# Patient Record
Sex: Female | Born: 1965
Health system: Southern US, Community
[De-identification: ages and names within clinical notes are randomized; demographics above are authoritative.]

## PROBLEM LIST (undated history)

## (undated) DIAGNOSIS — K589 Irritable bowel syndrome without diarrhea: Secondary | ICD-10-CM

## (undated) DIAGNOSIS — M069 Rheumatoid arthritis, unspecified: Secondary | ICD-10-CM

## (undated) DIAGNOSIS — M1990 Unspecified osteoarthritis, unspecified site: Secondary | ICD-10-CM

## (undated) DIAGNOSIS — T8859XA Other complications of anesthesia, initial encounter: Secondary | ICD-10-CM

## (undated) DIAGNOSIS — R112 Nausea with vomiting, unspecified: Secondary | ICD-10-CM

## (undated) DIAGNOSIS — S83209A Unspecified tear of unspecified meniscus, current injury, unspecified knee, initial encounter: Secondary | ICD-10-CM

## (undated) DIAGNOSIS — K219 Gastro-esophageal reflux disease without esophagitis: Secondary | ICD-10-CM

## (undated) DIAGNOSIS — Z9889 Other specified postprocedural states: Secondary | ICD-10-CM

## (undated) HISTORY — DX: Unspecified osteoarthritis, unspecified site: M19.90

## (undated) HISTORY — DX: Gastro-esophageal reflux disease without esophagitis: K21.9

## (undated) HISTORY — PX: JOINT REPLACEMENT: SHX530

## (undated) HISTORY — DX: Irritable bowel syndrome, unspecified: K58.9

## (undated) HISTORY — PX: OVARY SURGERY: SHX727

## (undated) HISTORY — DX: Rheumatoid arthritis, unspecified: M06.9

## (undated) HISTORY — PX: FOOT SURGERY: SHX648

## (undated) HISTORY — DX: Unspecified tear of unspecified meniscus, current injury, unspecified knee, initial encounter: S83.209A

---

## 1998-06-27 ENCOUNTER — Encounter: Admission: RE | Admit: 1998-06-27 | Discharge: 1998-06-27 | Payer: Self-pay | Admitting: Family Medicine

## 1998-07-04 ENCOUNTER — Encounter: Admission: RE | Admit: 1998-07-04 | Discharge: 1998-07-04 | Payer: Self-pay | Admitting: Family Medicine

## 1998-11-25 ENCOUNTER — Other Ambulatory Visit: Admission: RE | Admit: 1998-11-25 | Discharge: 1998-11-25 | Payer: Self-pay | Admitting: Gynecology

## 1999-12-21 ENCOUNTER — Other Ambulatory Visit: Admission: RE | Admit: 1999-12-21 | Discharge: 1999-12-21 | Payer: Self-pay | Admitting: Gynecology

## 2001-01-02 ENCOUNTER — Other Ambulatory Visit: Admission: RE | Admit: 2001-01-02 | Discharge: 2001-01-02 | Payer: Self-pay | Admitting: Gynecology

## 2002-01-11 ENCOUNTER — Other Ambulatory Visit: Admission: RE | Admit: 2002-01-11 | Discharge: 2002-01-11 | Payer: Self-pay | Admitting: Gynecology

## 2003-05-06 ENCOUNTER — Other Ambulatory Visit: Admission: RE | Admit: 2003-05-06 | Discharge: 2003-05-06 | Payer: Self-pay | Admitting: Gynecology

## 2004-05-20 ENCOUNTER — Other Ambulatory Visit: Admission: RE | Admit: 2004-05-20 | Discharge: 2004-05-20 | Payer: Self-pay | Admitting: Gynecology

## 2004-11-26 ENCOUNTER — Ambulatory Visit: Payer: Self-pay | Admitting: Gastroenterology

## 2005-05-20 ENCOUNTER — Other Ambulatory Visit: Admission: RE | Admit: 2005-05-20 | Discharge: 2005-05-20 | Payer: Self-pay | Admitting: Gynecology

## 2005-12-02 ENCOUNTER — Emergency Department (HOSPITAL_COMMUNITY): Admission: EM | Admit: 2005-12-02 | Discharge: 2005-12-02 | Payer: Self-pay | Admitting: Emergency Medicine

## 2005-12-09 ENCOUNTER — Emergency Department (HOSPITAL_COMMUNITY): Admission: EM | Admit: 2005-12-09 | Discharge: 2005-12-09 | Payer: Self-pay | Admitting: Emergency Medicine

## 2006-09-26 ENCOUNTER — Emergency Department (HOSPITAL_COMMUNITY): Admission: EM | Admit: 2006-09-26 | Discharge: 2006-09-27 | Payer: Self-pay | Admitting: Emergency Medicine

## 2008-05-09 ENCOUNTER — Other Ambulatory Visit: Admission: RE | Admit: 2008-05-09 | Discharge: 2008-05-09 | Payer: Self-pay | Admitting: Gynecology

## 2010-05-20 ENCOUNTER — Emergency Department (HOSPITAL_COMMUNITY): Admission: EM | Admit: 2010-05-20 | Discharge: 2010-05-20 | Payer: Self-pay | Admitting: Family Medicine

## 2011-10-17 ENCOUNTER — Emergency Department (HOSPITAL_COMMUNITY)
Admission: EM | Admit: 2011-10-17 | Discharge: 2011-10-17 | Disposition: A | Discharge status: Home / Self Care | Payer: BC Managed Care – PPO | Attending: Emergency Medicine | Admitting: Emergency Medicine

## 2011-10-17 ENCOUNTER — Emergency Department (HOSPITAL_COMMUNITY)
Admission: EM | Admit: 2011-10-17 | Discharge: 2011-10-17 | Disposition: A | Discharge status: Home / Self Care | Payer: BC Managed Care – PPO | Source: Home / Self Care | Attending: Family Medicine | Admitting: Family Medicine

## 2011-10-17 ENCOUNTER — Encounter (HOSPITAL_COMMUNITY): Payer: Self-pay | Admitting: Emergency Medicine

## 2011-10-17 DIAGNOSIS — R51 Headache: Secondary | ICD-10-CM | POA: Insufficient documentation

## 2011-10-17 DIAGNOSIS — N898 Other specified noninflammatory disorders of vagina: Secondary | ICD-10-CM | POA: Insufficient documentation

## 2011-10-17 DIAGNOSIS — R519 Headache, unspecified: Secondary | ICD-10-CM

## 2011-10-17 DIAGNOSIS — R350 Frequency of micturition: Secondary | ICD-10-CM | POA: Insufficient documentation

## 2011-10-17 LAB — URINE MICROSCOPIC-ADD ON

## 2011-10-17 LAB — POCT I-STAT, CHEM 8
Calcium, Ion: 1.27 mmol/L (ref 1.12–1.32)
Creatinine, Ser: 0.8 mg/dL (ref 0.50–1.10)
Glucose, Bld: 89 mg/dL (ref 70–99)
HCT: 43 % (ref 36.0–46.0)
Hemoglobin: 14.6 g/dL (ref 12.0–15.0)
TCO2: 30 mmol/L (ref 0–100)

## 2011-10-17 LAB — URINALYSIS, ROUTINE W REFLEX MICROSCOPIC
Glucose, UA: NEGATIVE mg/dL
Ketones, ur: NEGATIVE mg/dL
Protein, ur: NEGATIVE mg/dL
pH: 6 (ref 5.0–8.0)

## 2011-10-17 LAB — SEDIMENTATION RATE: Sed Rate: 40 mm/hr — ABNORMAL HIGH (ref 0–22)

## 2011-10-17 LAB — POCT PREGNANCY, URINE: Preg Test, Ur: NEGATIVE

## 2011-10-17 MED ORDER — KETOROLAC TROMETHAMINE 30 MG/ML IJ SOLN
30.0000 mg | Freq: Once | INTRAMUSCULAR | Status: AC
  Administered 2011-10-17: 30 mg via INTRAMUSCULAR
  Filled 2011-10-17: qty 1

## 2011-10-17 NOTE — ED Provider Notes (Signed)
History     CSN: [id number]  Arrival date & time 10/17/11  1818   First MD Initiated Contact with Patient 10/17/11 2001      Chief Complaint  Patient presents with  . Headache     HPI  History provided by the patient. Patient is a 46 year old African American female with no significant past medical history who presents urgent care Center for further evaluation of episodic right-sided headaches for the past 4 days. Patient reports having sharp episodic pains to the right temporal and posterior area of the head lasting about 30 seconds and occurring 3-4 times an hour. Symptoms began acutely. They're episodic and described as moderate. She has taken one dose of Aleve earlier in the day they gave her several hours of relief of symptoms. Patient also reports having small amounts of vaginal spotting this morning. Her last menstrual period was in November. Patient states that she believes she may be going through menopause. She denies having any hot flashes or night sweats. She denies any focal neurological deficits. Patient denies having similar symptoms previously. She denies any other aggravating or alleviating factors of headache. Patient does not have history of hypertension, hyperlipidemia, diabetes.    History reviewed. No pertinent past medical history.  History reviewed. No pertinent past surgical history.  No family history on file.  History  Substance Use Topics  . Smoking status: Not on file  . Smokeless tobacco: Not on file  . Alcohol Use: No    OB History    Grav Para Term Preterm Abortions TAB SAB Ect Mult Living                  Review of Systems  Constitutional: Negative for fever, chills, appetite change, fatigue and unexpected weight change.  HENT: Negative for ear pain, congestion, sore throat and rhinorrhea.   Respiratory: Negative for cough.   Genitourinary: Positive for frequency and vaginal bleeding. Negative for dysuria, hematuria, flank pain and vaginal  discharge.  Neurological: Positive for headaches. Negative for dizziness, speech difficulty, weakness, light-headedness and numbness.  Psychiatric/Behavioral: Negative for confusion.  All other systems reviewed and are negative.    Allergies  Review of patient's allergies indicates no known allergies.  Home Medications   Current Outpatient Rx  Name Route Sig Dispense Refill  . IBUPROFEN 200 MG PO TABS Oral Take 400 mg by mouth every 6 (six) hours as needed. For pain    . ADULT MULTIVITAMIN W/MINERALS CH Oral Take 1 tablet by mouth daily.      BP 130/75  Pulse 60  Temp(Src) 98 F (36.7 C) (Oral)  Resp 16  SpO2 100%  Physical Exam  Nursing note and vitals reviewed. Constitutional: She is oriented to person, place, and time. She appears well-developed and well-nourished. No distress.  HENT:  Head: Normocephalic and atraumatic.  Mouth/Throat: Oropharynx is clear and moist.  Eyes: Conjunctivae and EOM are normal. Pupils are equal, round, and reactive to light.  Neck: Normal range of motion. Neck supple. No tracheal deviation present.       No cervical midline tenderness  Cardiovascular: Normal rate and regular rhythm.   Pulmonary/Chest: Effort normal and breath sounds normal. No respiratory distress. She has no wheezes. She has no rales.  Musculoskeletal: She exhibits no edema and no tenderness.  Neurological: She is alert and oriented to person, place, and time. She has normal strength. No cranial nerve deficit or sensory deficit. Coordination and gait normal.  Skin: Skin is warm and dry. No  rash noted.  Psychiatric: She has a normal mood and affect. Her behavior is normal.    ED Course  Procedures  Results for orders placed during the hospital encounter of 10/17/11  SEDIMENTATION RATE      Component Value Range   Sed Rate 40 (*) 0 - 22 (mm/hr)  URINALYSIS, ROUTINE W REFLEX MICROSCOPIC      Component Value Range   Color, Urine YELLOW  YELLOW    APPearance CLEAR  CLEAR     Specific Gravity, Urine 1.011  1.005 - 1.030    pH 6.0  5.0 - 8.0    Glucose, UA NEGATIVE  NEGATIVE (mg/dL)   Hgb urine dipstick SMALL (*) NEGATIVE    Bilirubin Urine NEGATIVE  NEGATIVE    Ketones, ur NEGATIVE  NEGATIVE (mg/dL)   Protein, ur NEGATIVE  NEGATIVE (mg/dL)   Urobilinogen, UA 0.2  0.0 - 1.0 (mg/dL)   Nitrite NEGATIVE  NEGATIVE    Leukocytes, UA NEGATIVE  NEGATIVE   POCT PREGNANCY, URINE      Component Value Range   Preg Test, Ur NEGATIVE  NEGATIVE   POCT I-STAT, CHEM 8      Component Value Range   Sodium 142  135 - 145 (mEq/L)   Potassium 3.9  3.5 - 5.1 (mEq/L)   Chloride 104  96 - 112 (mEq/L)   BUN 15  6 - 23 (mg/dL)   Creatinine, Ser 4.09  0.50 - 1.10 (mg/dL)   Glucose, Bld 89  70 - 99 (mg/dL)   Calcium, Ion 8.11  9.14 - 1.32 (mmol/L)   TCO2 30  0 - 100 (mmol/L)   Hemoglobin 14.6  12.0 - 15.0 (g/dL)   HCT 78.2  95.6 - 21.3 (%)  URINE MICROSCOPIC-ADD ON      Component Value Range   Squamous Epithelial / LPF FEW (*) RARE    RBC / HPF 0-2  <3 (RBC/hpf)      1. Headache       MDM  8:05 PM patient seen and evaluated. Patient in no acute distress   9:15 PM patient reports feeling much better after medicine. She denies headache at this time.     Angus Seller, [place] 10/18/11 [phone]

## 2011-10-17 NOTE — ED Notes (Signed)
Pt sent from [facility] for further evaluation of headache.  C/o headache since Thursday.

## 2011-10-17 NOTE — Discharge Instructions (Signed)
You were seen and evaluated today for your symptoms of right-sided headache. At this time your lab tests and examination did not show any signs for concerning cause of your symptoms. Your providers today feel that you are able to return home and followup with your primary care provider for continued evaluation and treatment. Continue to use Tylenol or ibuprofen or Aleve for your symptoms. If you develop any continuous pain, fever, chills, persistent nausea vomiting, neurologic changes such as numbness, weakness, paralysis, change in speech, confusion please return to the emergency room immediately.  Headache, General, Unknown Cause The specific cause of your headache may not have been found today. There are many causes and types of headache. A few common ones are:  Tension headache.   Migraine.   Infections (examples: dental and sinus infections).   Bone and/or joint problems in the neck or jaw.   Depression.   Eye problems.  These headaches are not life threatening.  Headaches can sometimes be diagnosed by a patient history and a physical exam. Sometimes, lab and imaging studies (such as x-ray and/or CT scan) are used to rule out more serious problems. In some cases, a spinal tap (lumbar puncture) may be requested. There are many times when your exam and tests may be normal on the first visit even when there is a serious problem causing your headaches. Because of that, it is very important to follow up with your doctor or local clinic for further evaluation. FINDING OUT THE RESULTS OF TESTS  If a radiology test was performed, a radiologist will review your results.   You will be contacted by the emergency department or your physician if any test results require a change in your treatment plan.   Not all test results may be available during your visit. If your test results are not back during the visit, make an appointment with your caregiver to find out the results. Do not assume everything  is normal if you have not heard from your caregiver or the medical facility. It is important for you to follow up on all of your test results.  HOME CARE INSTRUCTIONS   Keep follow-up appointments with your caregiver, or any specialist referral.   Only take over-the-counter or prescription medicines for pain, discomfort, or fever as directed by your caregiver.   Biofeedback, massage, or other relaxation techniques may be helpful.   Ice packs or heat applied to the head and neck can be used. Do this three to four times per day, or as needed.   Call your doctor if you have any questions or concerns.   If you smoke, you should quit.  SEEK MEDICAL CARE IF:   You develop problems with medications prescribed.   You do not respond to or obtain relief from medications.   You have a change from the usual headache.   You develop nausea or vomiting.  SEEK IMMEDIATE MEDICAL CARE IF:   If your headache becomes severe.   You have an unexplained oral temperature above 102 F (38.9 C), or as your caregiver suggests.   You have a stiff neck.   You have loss of vision.   You have muscular weakness.   You have loss of muscular control.   You develop severe symptoms different from your first symptoms.   You start losing your balance or have trouble walking.   You feel faint or pass out.  MAKE SURE YOU:   Understand these instructions.   Will watch your condition.  Will get help right away if you are not doing well or get worse.  Document Released: 08/09/2005 Document Revised: 04/21/2011 Document Reviewed: 03/28/2008 [facility] Patient Information 2012 [place], [place].

## 2011-10-17 NOTE — ED Provider Notes (Signed)
History     CSN: [id number]  Arrival date & time 10/17/11  1515   First MD Initiated Contact with Patient 10/17/11 1518      Chief Complaint  Patient presents with  . Headache    (Consider location/radiation/quality/duration/timing/severity/associated sxs/prior treatment) Patient is a 46 y.o. female presenting with headaches. The history is provided by the patient.  Headache The primary symptoms include headaches. Primary symptoms do not include syncope, seizures, dizziness, visual change, focal weakness, loss of sensation, speech change, fever, nausea or vomiting. Primary symptoms comment: Started Thursday different than her other headaches thjat will respond to antiinflamatories and go away The symptoms began 3 to 5 days ago. The symptoms are waxing and waning. The neurological symptoms are focal.  The headache is not associated with aura, photophobia, visual change, neck stiffness or weakness.  Additional symptoms do not include neck stiffness, weakness, lower back pain, photophobia, aura, taste disturbance, hearing loss, tinnitus or irritability. Medical issues do not include seizures, cerebral vascular accident, drug use or diabetes.    History reviewed. No pertinent past medical history.  History reviewed. No pertinent past surgical history.  History reviewed. No pertinent family history.  History  Substance Use Topics  . Smoking status: Not on file  . Smokeless tobacco: Not on file  . Alcohol Use: No    OB History    Grav Para Term Preterm Abortions TAB SAB Ect Mult Living                  Review of Systems  Constitutional: Negative for fever, activity change and irritability.  HENT: Negative for hearing loss, neck stiffness and tinnitus.   Eyes: Negative for photophobia.  Cardiovascular: Negative for syncope.  Gastrointestinal: Negative for nausea and vomiting.  Genitourinary: Positive for vaginal bleeding and menstrual problem.       Patient has not had a  menstrual cycle since November and she started spoting this AM . She denies any recent sexual activity.  Neurological: Positive for headaches. Negative for dizziness, speech change, focal weakness, seizures and weakness.    Allergies  Review of patient's allergies indicates no known allergies.  Home Medications  No current outpatient prescriptions on file.  BP 128/76  Pulse 77  Temp(Src) 98.9 F (37.2 C) (Oral)  Resp 14  SpO2 98%  Physical Exam  Constitutional: She is oriented to person, place, and time. She appears well-developed and well-nourished.  HENT:  Head: Normocephalic and atraumatic.  Right Ear: Hearing and external ear normal.  Left Ear: Hearing and external ear normal.       Negative for TMJ tenderness Excessive cerumen in both ear canals  Eyes: Conjunctivae and EOM are normal. Pupils are equal, round, and reactive to light.  Fundoscopic exam:      The right eye shows no arteriolar narrowing and no AV nicking.       The left eye shows no arteriolar narrowing and no AV nicking.  Neck: Normal range of motion. Neck supple.  Musculoskeletal: Normal range of motion. She exhibits no edema.  Neurological: She is alert and oriented to person, place, and time. She displays normal reflexes. No cranial nerve deficit.  Skin: Skin is warm and dry. No erythema.  Psychiatric: She has a normal mood and affect. Her behavior is normal.    ED Course  Procedures (including critical care time)  Labs Reviewed - No data to display New headache    MDM          Hassan Rowan,  MD 10/17/11 1812

## 2011-10-17 NOTE — ED Notes (Signed)
Pt having "trigger" headache in one spot in her head since Thursday. Pain comes and goes up to a level of 7. She usually only has headaches when she is menstruating. She started spotting today after not having her period since November, she believes she is beginning menopause.

## 2011-10-18 NOTE — ED Provider Notes (Signed)
Medical screening examination/treatment/procedure(s) were performed by non-physician practitioner and as supervising physician I was immediately available for consultation/collaboration.  Kyarah Enamorado T Crissa Sowder, MD 10/18/11 2235 

## 2011-10-21 ENCOUNTER — Telehealth (HOSPITAL_COMMUNITY): Payer: Self-pay | Admitting: *Deleted

## 2011-10-21 NOTE — ED Notes (Signed)
2/25 Pt. called on VM with a question about her tx. 2/27 1452 Left message to call. 2/28 1450 Left message to call. Pt. called back and said she was sent to ED but CT scan was cancelled due to lack of neurological symptoms. Pt. told to continue Advil.  She said her bill was $200.00 and Dr. Thurmond Butts told her if she waited and was discharged by the ED she would only get 1 bill. I told her any billing questions she needs to call the billing office. She said she called them but they did not call her back.  I told her to call again. Pt. also said Dr. Thurmond Butts talked about something for ear wax that begins with D. I told her she can get Debrox ear wax softener OTC at the pharmacy. She said he was going to prescribe her something to help relax. I explained that we do not do Rx.'s for patients going to the ED. They discharge you and give you Rx.'s they feel like you need.  Danielle Pace 10/21/2011

## 2013-07-06 ENCOUNTER — Encounter: Payer: Self-pay | Admitting: Gastroenterology

## 2013-08-08 ENCOUNTER — Encounter: Payer: Self-pay | Admitting: Gastroenterology

## 2013-08-08 ENCOUNTER — Ambulatory Visit (INDEPENDENT_AMBULATORY_CARE_PROVIDER_SITE_OTHER): Payer: BC Managed Care – PPO | Admitting: Gastroenterology

## 2013-08-08 VITALS — BP 100/60 | HR 80 | Ht 59.75 in | Wt 145.2 lb

## 2013-08-08 DIAGNOSIS — K3189 Other diseases of stomach and duodenum: Secondary | ICD-10-CM | POA: Insufficient documentation

## 2013-08-08 DIAGNOSIS — Z1211 Encounter for screening for malignant neoplasm of colon: Secondary | ICD-10-CM

## 2013-08-08 MED ORDER — ESOMEPRAZOLE MAGNESIUM 40 MG PO CPDR: 40.0000 mg | DELAYED_RELEASE_CAPSULE | Freq: Every day | ORAL | Status: DC

## 2013-08-08 MED ORDER — NA SULFATE-K SULFATE-MG SULF 17.5-3.13-1.6 GM/177ML PO SOLN: 1.0000 | Freq: Once | ORAL | Status: DC

## 2013-08-08 NOTE — Progress Notes (Signed)
    _                                                                                                                History of Present Illness: 47 year old Afro-American female referred for evaluation of abdominal discomfort. She's complaining of frequent pyrosis, postprandial bloating and excess belching.  She has taken various over-the-counter medications with some relief.  She rarely takes ibuprofen.  There has been no change in bowel habits.  Bowels tend to be irregular.  She denies melena or hematochezia.  Her twin sister had colon polyps.    Past Medical History  Diagnosis Date  . Arthritis   . Irritable bowel syndrome    Past Surgical History  Procedure Laterality Date  . Foot surgery    . Ovary surgery     family history includes Breast cancer in her mother; Colon polyps in her sister; Diabetes in her mother; Hypertension in her mother; Kidney disease in her sister. Current Outpatient Prescriptions  Medication Sig Dispense Refill  . ibuprofen (ADVIL,MOTRIN) 200 MG tablet Take 400 mg by mouth every 6 (six) hours as needed. For pain      . Multiple Vitamin (MULITIVITAMIN WITH MINERALS) TABS Take 1 tablet by mouth daily.       No current facility-administered medications for this visit.   Allergies as of 08/08/2013  . (No Known Allergies)    reports that she has never smoked. She has never used smokeless tobacco. She reports that she does not drink alcohol or use illicit drugs.     Review of Systems: Pertinent positive and negative review of systems were noted in the above HPI section. All other review of systems were otherwise negative.  Vital signs were reviewed in today's medical record Physical Exam: General: Well developed , well nourished, no acute distress Skin: anicteric Head: Normocephalic and atraumatic Eyes:  sclerae anicteric, EOMI Ears: Normal auditory acuity Mouth: No deformity or lesions Neck: Supple, no masses or thyromegaly Lungs:  Clear throughout to auscultation Heart: Regular rate and rhythm; no murmurs, rubs or bruits Abdomen: Soft, non tender and non distended. No masses, hepatosplenomegaly or hernias noted. Normal Bowel sounds.  There is no succussion splash Rectal:deferred Musculoskeletal: Symmetrical with no gross deformities  Skin: No lesions on visible extremities Pulses:  Normal pulses noted Extremities: No clubbing, cyanosis, edema or deformities noted Neurological: Alert oriented x 4, grossly nonfocal Cervical Nodes:  No significant cervical adenopathy Inguinal Nodes: No significant inguinal adenopathy Psychological:  Alert and cooperative. Normal mood and affect  See Assessment and Plan under Problem List

## 2013-08-08 NOTE — Progress Notes (Signed)
.  rk1

## 2013-08-08 NOTE — Patient Instructions (Signed)

## 2013-08-08 NOTE — Assessment & Plan Note (Signed)
Identical twin sister had colon polyps at age 48  Recommendations #1 screening colonoscopy

## 2013-08-08 NOTE — Assessment & Plan Note (Signed)
The patient's symptoms are likely related to acid reflux.  She also has symptoms suggestive of nonspecific dyspepsia.  Recommendations #1 begin Nexium 40 mg daily.  If symptoms do not improve after several weeks of therapy I would consider upper endoscopy

## 2013-08-13 ENCOUNTER — Encounter: Payer: Self-pay | Admitting: Gastroenterology

## 2013-09-13 ENCOUNTER — Ambulatory Visit (AMBULATORY_SURGERY_CENTER): Payer: BC Managed Care – PPO | Admitting: Gastroenterology

## 2013-09-13 ENCOUNTER — Encounter: Payer: Self-pay | Admitting: Gastroenterology

## 2013-09-13 VITALS — BP 114/73 | HR 68 | Temp 98.6°F | Resp 21 | Ht 59.0 in | Wt 145.0 lb

## 2013-09-13 DIAGNOSIS — Z8371 Family history of colonic polyps: Secondary | ICD-10-CM

## 2013-09-13 DIAGNOSIS — Z83719 Family history of colon polyps, unspecified: Secondary | ICD-10-CM

## 2013-09-13 DIAGNOSIS — Z1211 Encounter for screening for malignant neoplasm of colon: Secondary | ICD-10-CM

## 2013-09-13 MED ORDER — SODIUM CHLORIDE 0.9 % IV SOLN: 500.0000 mL | INTRAVENOUS | Status: DC

## 2013-09-13 NOTE — Progress Notes (Signed)
Pt. Monitored throughout recovery period.  Vital signs were stable, although they were not tranferred to computer.  Pt. Doing well at discharge .   Vital signs stable.

## 2013-09-13 NOTE — Op Note (Signed)
[facility] [address] [place], [address]   COLONOSCOPY PROCEDURE REPORT  PATIENT: Danielle Pace, Danielle Pace  MR#: [id number] BIRTHDATE: [birth year] , 47  yrs. old GENDER: Female ENDOSCOPIST: Louis Meckel, MD REFERRED QT:[id number] Kevan Ny, M.D. PROCEDURE DATE:  09/13/2013 PROCEDURE:   Colonoscopy, diagnostic First Screening Colonoscopy - Avg.  risk and is 50 yrs.  old or older Yes.  Prior Negative Screening - Now for repeat screening. N/A  History of Adenoma - Now for follow-up colonoscopy & has been > or = to 3 yrs.  N/A  Polyps Removed Today? No.  Recommend repeat exam, <10 yrs? No. ASA CLASS:   Class II INDICATIONS:Patient's family history of colon polyps identical twin sister MEDICATIONS: propofol (Diprivan) 300mg  IV  DESCRIPTION OF PROCEDURE:   After the risks benefits and alternatives of the procedure were thoroughly explained, informed consent was obtained.  A digital rectal exam revealed no abnormalities of the rectum.   The LB [id number]  endoscope was introduced through the anus and advanced to the cecum, which was identified by both the appendix and ileocecal valve. No adverse events experienced.   The quality of the prep was excellent using Suprep  The instrument was then slowly withdrawn as the colon was fully examined.      COLON FINDINGS: A normal appearing cecum, ileocecal valve, and appendiceal orifice were identified.  The ascending, hepatic flexure, transverse, splenic flexure, descending, sigmoid colon and rectum appeared unremarkable.  No polyps or cancers were seen. Retroflexion was not performed due to a narrow rectal vault. The time to cecum=3 minutes 06 seconds.  Withdrawal time=9 minutes 32 seconds.  The scope was withdrawn and the procedure completed. COMPLICATIONS: There were no complications.  ENDOSCOPIC IMPRESSION: Normal colon  RECOMMENDATIONS: Continue current colorectal screening recommendations for "routine risk"  patients with a repeat colonoscopy in 10 years.   eSigned:  [id number], MD 09/13/2013 3:43 PM   cc:   PATIENT NAME:  Nella, [place] MR#: [name]

## 2013-09-13 NOTE — Progress Notes (Signed)
A/ox3 pleased with MAC, report to Jane RN 

## 2013-09-13 NOTE — Patient Instructions (Signed)
YOU HAD AN ENDOSCOPIC PROCEDURE TODAY AT THE [facility]: Refer to the procedure report that was given to you for any specific questions about what was found during the examination.  If the procedure report does not answer your questions, please call your gastroenterologist to clarify.  If you requested that your care partner not be given the details of your procedure findings, then the procedure report has been included in a sealed envelope for you to review at your convenience later.  YOU SHOULD EXPECT: Some feelings of bloating in the abdomen. Passage of more gas than usual.  Walking can help get rid of the air that was put into your GI tract during the procedure and reduce the bloating. If you had a lower endoscopy (such as a colonoscopy or flexible sigmoidoscopy) you may notice spotting of blood in your stool or on the toilet paper. If you underwent a bowel prep for your procedure, then you may not have a normal bowel movement for a few days.  DIET: Your first meal following the procedure should be a light meal and then it is ok to progress to your normal diet.  A half-sandwich or bowl of soup is an example of a good first meal.  Heavy or fried foods are harder to digest and may make you feel nauseous or bloated.  Likewise meals heavy in dairy and vegetables can cause extra gas to form and this can also increase the bloating.  Drink plenty of fluids but you should avoid alcoholic beverages for 24 hours.  ACTIVITY: Your care partner should take you home directly after the procedure.  You should plan to take it easy, moving slowly for the rest of the day.  You can resume normal activity the day after the procedure however you should NOT DRIVE or use heavy machinery for 24 hours (because of the sedation medicines used during the test).    SYMPTOMS TO REPORT IMMEDIATELY: A gastroenterologist can be reached at any hour.  During normal business hours, 8:30 AM to 5:00 PM Monday through Friday,  call [phone].  After hours and on weekends, please call the GI answering service at [phone] who will take a message and have the physician on call contact you.   Following lower endoscopy (colonoscopy or flexible sigmoidoscopy):  Excessive amounts of blood in the stool  Significant tenderness or worsening of abdominal pains  Swelling of the abdomen that is new, acute  Fever of 100F or higher    FOLLOW UP: If any biopsies were taken you will be contacted by phone or by letter within the next 1-3 weeks.  Call your gastroenterologist if you have not heard about the biopsies in 3 weeks.  Our staff will call the home number listed on your records the next business day following your procedure to check on you and address any questions or concerns that you may have at that time regarding the information given to you following your procedure. This is a courtesy call and so if there is no answer at the home number and we have not heard from you through the emergency physician on call, we will assume that you have returned to your regular daily activities without incident.  SIGNATURES/CONFIDENTIALITY: You and/or your care partner have signed paperwork which will be entered into your electronic medical record.  These signatures attest to the fact that that the information above on your After Visit Summary has been reviewed and is understood.  Full responsibility of the confidentiality   of this discharge information lies with you and/or your care-partner.  Normal colonoscopy-Repeat in 10 years-2025. 

## 2013-09-14 ENCOUNTER — Telehealth: Payer: Self-pay | Admitting: *Deleted

## 2013-09-14 NOTE — Telephone Encounter (Signed)
  Follow up Call-  Call back number 09/13/2013  Post procedure Call Back phone  # [phone]  Permission to leave phone message Yes     Patient questions:  Do you have a fever, pain , or abdominal swelling? no Pain Score  0 *  Have you tolerated food without any problems? yes  Have you been able to return to your normal activities? yes  Do you have any questions about your discharge instructions: Diet   no Medications  no Follow up visit  no  Do you have questions or concerns about your Care? no  Actions: * If pain score is 4 or above: No action needed, pain <4. Patient stating she had some gas discomfort this am. Encouraged to drink warm fluids and call back if she needs [place].

## 2014-06-23 ENCOUNTER — Emergency Department (HOSPITAL_COMMUNITY): Payer: BC Managed Care – PPO

## 2014-06-23 ENCOUNTER — Emergency Department (HOSPITAL_COMMUNITY)
Admission: EM | Admit: 2014-06-23 | Discharge: 2014-06-23 | Disposition: A | Discharge status: Home / Self Care | Payer: BC Managed Care – PPO | Attending: Emergency Medicine | Admitting: Emergency Medicine

## 2014-06-23 ENCOUNTER — Encounter (HOSPITAL_COMMUNITY): Payer: Self-pay | Admitting: Emergency Medicine

## 2014-06-23 DIAGNOSIS — R609 Edema, unspecified: Secondary | ICD-10-CM

## 2014-06-23 DIAGNOSIS — Z79899 Other long term (current) drug therapy: Secondary | ICD-10-CM | POA: Insufficient documentation

## 2014-06-23 DIAGNOSIS — M179 Osteoarthritis of knee, unspecified: Secondary | ICD-10-CM | POA: Insufficient documentation

## 2014-06-23 DIAGNOSIS — M25561 Pain in right knee: Secondary | ICD-10-CM | POA: Diagnosis present

## 2014-06-23 DIAGNOSIS — K219 Gastro-esophageal reflux disease without esophagitis: Secondary | ICD-10-CM | POA: Diagnosis not present

## 2014-06-23 DIAGNOSIS — M1990 Unspecified osteoarthritis, unspecified site: Secondary | ICD-10-CM

## 2014-06-23 NOTE — Discharge Instructions (Signed)
Please call your doctor for a followup appointment within 24-48 hours. When you talk to your doctor please let them know that you were seen in the emergency department and have them acquire all of your records so that they can discuss the findings with you and formulate a treatment plan to fully care for your new and ongoing problems. Please call and set up an appointment with her primary care provider to be seen earlier this week Please call and set up anappointment with orthopedics to be seen earlier this week Please rest, ice, elevate Please keep mean sleeve at all times for comfort purposes Please apply ice to help aid in reduction of swelling Please avoid any physical fitness activity Please acquire insoles for shoe for comfort purposes Please continue to monitor symptoms closely and if symptoms are to worsen or change (fever greater than 101, chills, sweating, nausea, vomiting, chest pain, shortness of breathe, difficulty breathing, weakness, numbness, tingling, worsening or changes to pain pattern, Fall, injury, loss of sensation, changes in skin color, hot to the touch, red streaks running down the leg) please report back to the Emergency Department immediately.    Arthritis, Nonspecific Arthritis is pain, redness, warmth, or puffiness (inflammation) of a joint. The joint may be stiff or hurt when you move it. One or more joints may be affected. There are many types of arthritis. Your doctor may not know what type you have right away. The most common cause of arthritis is wear and tear on the joint (osteoarthritis). HOME CARE   Only take medicine as told by your doctor.  Rest the joint as much as possible.  Raise (elevate) your joint if it is puffy.  Use crutches if the painful joint is in your leg.  Drink enough fluids to keep your pee (urine) clear or pale yellow.  Follow your doctor's diet instructions.  Use cold packs for very bad joint pain for 10 to 15 minutes every hour. Ask  your doctor if it is okay for you to use hot packs.  Exercise as told by your doctor.  Take a warm shower if you have stiffness in the morning.  Move your sore joints throughout the day. GET HELP RIGHT AWAY IF:   You have a fever.  You have very bad joint pain, puffiness, or redness.  You have many joints that are painful and puffy.  You are not getting better with treatment.  You have very bad back pain or leg weakness.  You cannot control when you poop (bowel movement) or pee (urinate).  You do not feel better in 24 hours or are getting worse.  You are having side effects from your medicine. MAKE SURE YOU:   Understand these instructions.  Will watch your condition.  Will get help right away if you are not doing well or get worse. Document Released: 11/03/2009 Document Revised: 02/08/2012 Document Reviewed: 11/03/2009 [facility] Patient Information 2015 [place], [place]. This information is not intended to replace advice given to you by your health care provider. Make sure you discuss any questions you have with your health care provider.

## 2014-06-23 NOTE — ED Notes (Signed)
Per pt, states right knee pain/swelling for 2 weeks-is suppose to have vein lasered on that leg-doesn't know if it is related, no injury

## 2014-06-23 NOTE — ED Provider Notes (Signed)
CSN: [id number]     Arrival date & time 06/23/14  1418 History   First MD Initiated Contact with Patient 06/23/14 1546     Chief Complaint  Patient presents with  . Knee Pain     (Consider location/radiation/quality/duration/timing/severity/associated sxs/prior Treatment) The history is provided by the patient. No language interpreter was used.  Danielle Pace is a 48 y/o F with PMHx of IBS, GERD, arthritis presenting to the ED with right knee pain that has been ongoing for a week and a half with worsening yesterday. Patient reported that thepain is localized to the right knee described as a sharp pain that is constant without radiation. Stated that the pain worsens after sitting for long periods of time and then standing. Reported that the other day she noticed swelling to the right knee has been using Advil with warm compressions.patient reported that she works in a [employer] where she is constant on her feet-stated that she wears steel toed shoes without support. Reported that she's been seen and assessed by orthopedics months before, followed by Danielle Pace. Denied diabetes or HIV. Denied loss of sensation, numbness, tingling, falls, trauma, previous injury, changes to skin color, fever, chills, travel. PCP Danielle Pace  Past Medical History  Diagnosis Date  . Arthritis   . Irritable bowel syndrome   . GERD (gastroesophageal reflux disease)    Past Surgical History  Procedure Laterality Date  . Foot surgery    . Ovary surgery     Family History  Problem Relation Age of Onset  . Breast cancer Mother   . Diabetes Mother   . Hypertension Mother   . Kidney disease Sister   . Colon polyps Sister    History  Substance Use Topics  . Smoking status: Never Smoker   . Smokeless tobacco: Never Used  . Alcohol Use: No   OB History    No data available     Review of Systems  Constitutional: Negative for fever and chills.  Cardiovascular: Negative for leg swelling.   Musculoskeletal: Positive for arthralgias (right knee pain).  Skin: Negative for color change, rash and wound.  Neurological: Negative for weakness and numbness.      Allergies  Review of patient's allergies indicates no known allergies.  Home Medications   Prior to Admission medications   Medication Sig Start Date End Date Taking? Authorizing Provider  Benzocaine (KANK-A MOUTH PAIN MT) Use as directed 1 application in the mouth or throat as needed (cold sore).   Yes Historical Provider, MD  Chlorphen-Pseudoephed-APAP (THERAFLU FLU/COLD PO) Take 1 packet by mouth every 4 (four) hours as needed (flu like symptoms).   Yes Historical Provider, MD  esomeprazole (NEXIUM) 40 MG capsule Take 1 capsule (40 mg total) by mouth daily at 12 noon. Patient taking differently: Take 40 mg by mouth daily as needed (acid reflux).  08/08/13  Yes Louis Meckel, MD  ibuprofen (ADVIL,MOTRIN) 200 MG tablet Take 400 mg by mouth every 6 (six) hours as needed for moderate pain. For pain   Yes Historical Provider, MD  Multiple Vitamin (MULITIVITAMIN WITH MINERALS) TABS Take 1 tablet by mouth daily.   Yes Historical Provider, MD  Phenyleph-Doxylamine-DM-APAP (ALKA SELTZER PLUS PO) Take 2 tablets by mouth every 4 (four) hours as needed (flu like symptoms).   Yes Historical Provider, MD  vitamin C (ASCORBIC ACID) 500 MG tablet Take 500 mg by mouth daily.   Yes Historical Provider, MD  KERYDIN 5 % SOLN  09/12/13   Historical  Provider, MD  terbinafine (LAMISIL) 250 MG tablet  09/11/13   Historical Provider, MD   BP 136/75 mmHg  Pulse 72  Temp(Src) 98.4 F (36.9 C) (Oral)  Resp 16  SpO2 100%  LMP 05/29/2014 Physical Exam  Constitutional: She is oriented to person, place, and time. She appears well-developed and well-nourished. No distress.  HENT:  Head: Atraumatic.  Eyes: Conjunctivae and EOM are normal.  Neck: Normal range of motion. Neck supple.  Cardiovascular: Normal rate, regular rhythm and normal heart  sounds.  Exam reveals no friction rub.   No murmur heard. Pulses:      Radial pulses are 2+ on the right side, and 2+ on the left side.       Dorsalis pedis pulses are 2+ on the right side, and 2+ on the left side.  Cap refill less than 3 seconds Negative swelling or pitting edema identified to the lower tremors bilaterally Negative Homans sign  Pulmonary/Chest: Effort normal and breath sounds normal. No respiratory distress. She has no wheezes. She has no rales.  Musculoskeletal: Normal range of motion. She exhibits edema and tenderness.  Mild swelling identified to the anterior aspect of the right knee with negative erythema, inflammation, warmth upon palpation, induration or fluctuance, ecchymosis, lesions, sores. Discomfort upon palpation to the anterior aspect of the right knee.Full flexion-extension of the right knee without difficulty or pain noted on examination. Negative crepitus. Negative valgus and varus tension. Negative anterior posterior drawer sign. Full range of motion to right ankle and digits of the right foot-patient is able to wiggle toes without difficulty.  Neurological: She is alert and oriented to person, place, and time. No cranial nerve deficit. She exhibits normal muscle tone. Coordination normal.  Cranial nerves III-XII grossly intact Strength 5+/5+ to lower extremities bilaterally with resistance applied, equal distribution noted Strength intact to digits the right foot Sensation intact to differentiation to sharp and dull touch  Skin: Skin is warm and dry. No rash noted. She is not diaphoretic. No erythema.  Psychiatric: She has a normal mood and affect. Her behavior is normal. Thought content normal.  Nursing note and vitals reviewed.   ED Course  Procedures (including critical care time) Labs Review Labs Reviewed - No data to display  Imaging Review Dg Knee Complete 4 Views Right  06/23/2014   CLINICAL DATA:  Right knee pain and swelling for 1 week.  EXAM:  RIGHT KNEE - COMPLETE 4+ VIEW  COMPARISON:  None.  FINDINGS: No acute fracture. No dislocation. Minimal degenerative change of the knee joint.  IMPRESSION: No acute bony pathology.   Electronically Signed   By: Maryclare Bean M.D.   On: 06/23/2014 15:35     EKG Interpretation None      MDM   Final diagnoses:  Right knee pain  Arthritis    Medications - No data to display  Filed Vitals:   06/23/14 1424 06/23/14 1701  BP: 131/69 136/75  Pulse: 83 72  Temp: 98.1 F (36.7 C) 98.4 F (36.9 C)  TempSrc: Oral Oral  Resp: 16 16  SpO2: 100% 100%   Plain film of right knee negative for acute osseous injury or joint effusion. Doubt septic joint. Negative findings of ischemia. Doubt compartment syndrome. Stable right knee joint noted on exam with negative focal neurological deficits, pulses palpable and strong. Full range of motion noted without difficulty or ataxia. Suspicion to be arthritis. Patient stable, afebrile. Patient not septic appearing. Discharged patient. Patient placed in knee sleeve for comfort.  Discussed with patient to rest, ice, elevate. Recommended patient to acquire insoles for her shoes for proper comfort purposes. Refer to PCP and orthopedics. Discussed with patient to closely monitor symptoms and if symptoms are to worsen or change to report back to the ED - strict return instructions given.  Patient agreed to plan of care, understood, all questions answered.   Raymon Mutton, PA-C 06/23/14 1704  Rolland Porter, MD 06/28/14 [phone]

## 2014-06-27 ENCOUNTER — Encounter: Payer: Self-pay | Admitting: Family

## 2014-06-27 ENCOUNTER — Ambulatory Visit (INDEPENDENT_AMBULATORY_CARE_PROVIDER_SITE_OTHER): Payer: BC Managed Care – PPO | Admitting: Family

## 2014-06-27 VITALS — BP 132/84 | HR 75 | Temp 98.2°F | Resp 18 | Ht 60.0 in | Wt 148.4 lb

## 2014-06-27 DIAGNOSIS — E7800 Pure hypercholesterolemia, unspecified: Secondary | ICD-10-CM

## 2014-06-27 DIAGNOSIS — R7309 Other abnormal glucose: Secondary | ICD-10-CM

## 2014-06-27 DIAGNOSIS — E785 Hyperlipidemia, unspecified: Secondary | ICD-10-CM | POA: Insufficient documentation

## 2014-06-27 DIAGNOSIS — R7303 Prediabetes: Secondary | ICD-10-CM

## 2014-06-27 DIAGNOSIS — E559 Vitamin D deficiency, unspecified: Secondary | ICD-10-CM

## 2014-06-27 NOTE — Assessment & Plan Note (Signed)
Start OTC Vitamin D 600 IU daily. Recommended [employer] as they have the [employer] logo.

## 2014-06-27 NOTE — Assessment & Plan Note (Signed)
A1c of 5.9 is within the prediabetes range. Continue with lifestyle modifications of increasing fruits, vegetables, fiber and lean proteins. Also increasing exercise to about 30 minutes of moderate activity most days of the week. Goal is to lose about 5% of her current body weight in the next 6 months.

## 2014-06-27 NOTE — Progress Notes (Signed)
   Subjective:    Patient ID: Danielle Pace, female    DOB: 07/24/1966, 48 y.o.   MRN: [id number]  Chief Complaint  Patient presents with  . Establish Care    wants blood work done for work; fasting    HPI:  Danielle Pace is a 48 y.o. female who presents today to establish care and discuss blood work that she received from her place of employment.   On October 23rd her employer provided lab services to employees to evaluate their overall health. She was referred to a PCP in order to address several of the highlighted labs that were presented. These labs included a low Vitamin D level, A1c of 5.9, a LDL of 119 and an HDL of 39. Indicates that she is fasting and wondering if she needs additional blood work done.   No Known Allergies  Current Outpatient Prescriptions on File Prior to Visit  Medication Sig Dispense Refill  . esomeprazole (NEXIUM) 40 MG capsule Take 1 capsule (40 mg total) by mouth daily at 12 noon. (Patient taking differently: Take 40 mg by mouth daily as needed (acid reflux). ) 30 capsule 2  . ibuprofen (ADVIL,MOTRIN) 200 MG tablet Take 400 mg by mouth every 6 (six) hours as needed for moderate pain. For pain    . Multiple Vitamin (MULITIVITAMIN WITH MINERALS) TABS Take 1 tablet by mouth daily.    Marland Kitchen Phenyleph-Doxylamine-DM-APAP (ALKA SELTZER PLUS PO) Take 2 tablets by mouth every 4 (four) hours as needed (flu like symptoms).    . vitamin C (ASCORBIC ACID) 500 MG tablet Take 500 mg by mouth daily.     No current facility-administered medications on file prior to visit.   Past Medical History  Diagnosis Date  . Arthritis   . Irritable bowel syndrome   . GERD (gastroesophageal reflux disease)    Review of Systems    See HPI Objective:    BP 132/84 mmHg  Pulse 75  Temp(Src) 98.2 F (36.8 C) (Oral)  Resp 18  Ht 5' (1.524 m)  Wt 148 lb 6.4 oz (67.314 kg)  BMI 28.98 kg/m2  SpO2 96%  LMP 05/29/2014 Nursing note and vital signs reviewed.  Physical Exam    Constitutional: She is oriented to person, place, and time. She appears well-developed and well-nourished. No distress.  Cardiovascular: Normal rate, regular rhythm, normal heart sounds and intact distal pulses.   Pulmonary/Chest: Effort normal and breath sounds normal.  Neurological: She is alert and oriented to person, place, and time.  Skin: Skin is warm and dry.  Psychiatric: She has a normal mood and affect. Her behavior is normal. Judgment and thought content normal.       Assessment & Plan:

## 2014-06-27 NOTE — Patient Instructions (Signed)
Thank you for choosing [facility].  Summary/Instructions:   Increase fruit and vegetable intake - preferable full fruits such as apples, pears, strawberries, celery, lettuces, carrots, etc  Vitamin D 600 IU per day   Increase physical activity to 30 minutes most days of the week.  Follow up in about 3 months for a physical and blood work.

## 2014-06-27 NOTE — Assessment & Plan Note (Signed)
Current LDL at 117 which is above goal of 100. Continue with lifestyle changes including reduction in saturated fats and increased lean meats, fruits and vegetables. Increase activity to 30 minutes most days of the week. Follow up in about 3 months to determine status.

## 2014-06-27 NOTE — Progress Notes (Signed)
Pre visit review using our clinic review tool, if applicable. No additional management support is needed unless otherwise documented below in the visit note. 

## 2014-07-04 ENCOUNTER — Ambulatory Visit: Payer: BC Managed Care – PPO | Admitting: Family

## 2014-07-24 ENCOUNTER — Telehealth: Payer: Self-pay | Admitting: Gastroenterology

## 2014-07-24 NOTE — Telephone Encounter (Signed)
Left message advising she be evaluated by provider. Requested she call back and ask for [facility] or nurse to be scheduled or she call her PCP with the same request.

## 2014-07-24 NOTE — Telephone Encounter (Signed)
Spoke with the patient. She reports a cramping and then a bowel movement with blood passing from her rectum. She had though at first she was menstruating, but then when it happen again the next day, she realized the blood was from the rectum. There is no rectal pain, dizziness or nausea. Appointment scheduled.

## 2014-07-25 ENCOUNTER — Encounter: Payer: Self-pay | Admitting: Physician Assistant

## 2014-07-25 ENCOUNTER — Ambulatory Visit (INDEPENDENT_AMBULATORY_CARE_PROVIDER_SITE_OTHER): Payer: BC Managed Care – PPO | Admitting: Physician Assistant

## 2014-07-25 ENCOUNTER — Other Ambulatory Visit (INDEPENDENT_AMBULATORY_CARE_PROVIDER_SITE_OTHER): Payer: BC Managed Care – PPO

## 2014-07-25 VITALS — BP 104/60 | HR 76 | Ht 59.5 in | Wt 146.4 lb

## 2014-07-25 DIAGNOSIS — R109 Unspecified abdominal pain: Secondary | ICD-10-CM

## 2014-07-25 DIAGNOSIS — K625 Hemorrhage of anus and rectum: Secondary | ICD-10-CM

## 2014-07-25 DIAGNOSIS — R1084 Generalized abdominal pain: Secondary | ICD-10-CM

## 2014-07-25 LAB — BASIC METABOLIC PANEL
BUN: 11 mg/dL (ref 6–23)
CALCIUM: 8.9 mg/dL (ref 8.4–10.5)
CO2: 27 mEq/L (ref 19–32)
Chloride: 104 mEq/L (ref 96–112)
Creatinine, Ser: 0.8 mg/dL (ref 0.4–1.2)
GFR: 92.36 mL/min (ref >60.00)
Glucose, Bld: 81 mg/dL (ref 70–99)
Potassium: 3.5 mEq/L (ref 3.5–5.1)
Sodium: 140 mEq/L (ref 135–145)

## 2014-07-25 LAB — CBC WITH DIFFERENTIAL/PLATELET
BASOS PCT: 0.9 % (ref 0.0–3.0)
Basophils Absolute: 0.1 10*3/uL (ref 0.0–0.1)
EOS PCT: 9.3 % — AB (ref 0.0–5.0)
Eosinophils Absolute: 0.8 10*3/uL — ABNORMAL HIGH (ref 0.0–0.7)
HEMATOCRIT: 38.9 % (ref 36.0–46.0)
Hemoglobin: 12.7 g/dL (ref 12.0–15.0)
LYMPHS ABS: 1.6 10*3/uL (ref 0.7–4.0)
Lymphocytes Relative: 17.6 % (ref 12.0–46.0)
MCHC: 32.7 g/dL (ref 30.0–36.0)
MCV: 90 fl (ref 78.0–100.0)
MONO ABS: 0.8 10*3/uL (ref 0.1–1.0)
Monocytes Relative: 9.1 % (ref 3.0–12.0)
Neutro Abs: 5.7 10*3/uL (ref 1.4–7.7)
Neutrophils Relative %: 63.1 % (ref 43.0–77.0)
Platelets: 302 10*3/uL (ref 150.0–400.0)
RBC: 4.32 Mil/uL (ref 3.87–5.11)
RDW: 13.9 % (ref 11.5–15.5)
WBC: 9.1 10*3/uL (ref 4.0–10.5)

## 2014-07-25 NOTE — Progress Notes (Signed)
Patient ID: Danielle Pace, female   DOB: February 06, 1966, 48 y.o.   MRN: [id number]   Subjective:    Patient ID: Danielle Pace, female    DOB: 1966/04/07, 48 y.o.   MRN: [id number]  HPI Danielle Pace is a pleasant 48 year old female known to Dr. Arlyce Dice. She had undergone colonoscopy in January 2015 because of family history of colon polyps in her twins sister. Patient's colonoscopy was normal. She does have diagnosis of IBS. She comes in today after acute onset of crampy mid and lower abdominal pain 2 days ago. She says she had fairly severe cramping for a couple of hours and then passed a fairly normal bowel movement mixed with bright red blood. She had several episodes that evening with passage of looser stools and dark red blood. The following morning she continued to have some cramping and 1 episode of small amount of stool with blood and then felt better the remainder of the day. Today she's had some minimal cramping but no blood noted in her stool. She's not had any nausea or vomiting is able to eat without difficulty no fever or chills. She's not on any antihypertensives or hormones. She had been taking ibuprofen regularly for couple of weeks before that because of a knee injury but hadn't taken any this week. She stays on Nexium 40 mg chronically for GERD. Patient showed me a picture she had taken on her phone first day she had the cramping and bleeding and showed a small amount of dark red blood in the bottom of the commode.  Review of Systems Pertinent positive and negative review of systems were noted in the above HPI section.  All other review of systems was otherwise negative.  Outpatient Encounter Prescriptions as of 07/25/2014  Medication Sig  . esomeprazole (NEXIUM) 40 MG capsule Take 1 capsule (40 mg total) by mouth daily at 12 noon. (Patient taking differently: Take 40 mg by mouth as needed (acid reflux). )  . ibuprofen (ADVIL,MOTRIN) 200 MG tablet Take 400 mg by mouth every 6 (six) hours  as needed for moderate pain. For pain  . Multiple Vitamin (MULITIVITAMIN WITH MINERALS) TABS Take 1 tablet by mouth daily.  . vitamin C (ASCORBIC ACID) 500 MG tablet Take 500 mg by mouth daily.  Marland Kitchen Phenyleph-Doxylamine-DM-APAP (ALKA SELTZER PLUS PO) Take 2 tablets by mouth every 4 (four) hours as needed (flu like symptoms).   No Known Allergies Patient Active Problem List   Diagnosis Date Noted  . Vitamin D deficiency 06/27/2014  . Prediabetes 06/27/2014  . Elevated LDL cholesterol level 06/27/2014  . Dyspepsia and other specified disorders of function of stomach 08/08/2013  . Special screening for malignant neoplasms, colon 08/08/2013   History   Social History  . Marital Status: Single    Spouse Name: N/A    Number of Children: 0  . Years of Education: 12   Occupational History  . production    Social History Main Topics  . Smoking status: Never Smoker   . Smokeless tobacco: Never Used  . Alcohol Use: No  . Drug Use: No  . Sexual Activity: No   Other Topics Concern  . Not on file   Social History Narrative   Born and raised [place], [place] and moved to [place], [place]. Currently living in a condo with her 2 dogs. Fun: Movies, hang out with friends, entertainment.   [employer] - no blood product    Danielle Pace's family history includes Breast cancer in her mother; Colon polyps in  her sister; Diabetes in her mother; Hypertension in her mother; Kidney disease in her sister.      Objective:    Filed Vitals:   07/25/14 1339  BP: 104/60  Pulse: 76    Physical Exam well-developed African-American female in no acute distress, pleasant height 4 foot 11 weight 146. HEENT; nontraumatic normocephalic EOMI PERRLA sclera anicteric, Supple; no JVD, Cardiovascular; regular rate and rhythm with S1-S2 no murmur or gallop, Pulmonary clear bilaterally, Abdomen; soft bowel sounds are present she has some mild tenderness in the left mid quadrant and mid abdomen there is no guarding  or rebound no palpable mass or hepatosplenomegaly, Rectal; exam was not done, patient brought pictures with her of dark red blood in the commode., Extremities ;no clubbing cyanosis or edema skin warm dry, Psych; mood and affect appropriate   Assessment & Plan:   #38 48 -year-old female with 2 day history of crampy lower abdominal pain followed by bloody stool which lasted for 24 hours and is now resolving. I suspect she had a mild episode of segmental ischemic colitis., Rule out other inflammatory etiologies #2 normal colonoscopy January 2015  Plan; CBC and BMET today Discussed conservative management with  the patient as she is  improved at this point ,however she is very concerned and therefore will pursue CT scan of the abdomen and pelvis. Continue Nexium 40 mg by mouth daily Stop ibuprofen Further plans pending results of CT    Joyce Heitman S Tamula Morrical PA-C 07/25/2014

## 2014-07-25 NOTE — Patient Instructions (Signed)
Please go to the basement level to have your labs drawn.  Continue Nexium 40 mg daily. Stop Ibuprofen, take no NSAIDS.   You have been scheduled for a CT scan of the abdomen and pelvis at [facility] ([address]---this is in the same building as Press [facility]).   You are scheduled on Friday 07-26-2014 at 3:00 PM . You should arrive at 2:45 PM   to your appointment  for registration. Please follow the written instructions below on the day of your exam:  WARNING: IF YOU ARE ALLERGIC TO IODINE/X-RAY DYE, PLEASE NOTIFY RADIOLOGY IMMEDIATELY AT [phone]! YOU WILL BE GIVEN A 13 HOUR PREMEDICATION PREP.  1) Do not eat or drink anything after 11:00 am  (4 hours prior to your test) 2) You have been given 2 bottles of oral contrast to drink. The solution may taste better if refrigerated, but do NOT add ice or any other liquid to this solution. Shake well before drinking.    Drink 1 bottle of contrast @ 1:00 PM (2 hours prior to your exam)  Drink 1 bottle of contrast @ 2:00 PM  (1 hour prior to your exam)  You may take any medications as prescribed with a small amount of water except for the following: Metformin, Glucophage, Glucovance, Avandamet, Riomet, Fortamet, Actoplus Met, Janumet, Glumetza or Metaglip. The above medications must be held the day of the exam AND 48 hours after the exam.  The purpose of you drinking the oral contrast is to aid in the visualization of your intestinal tract. The contrast solution may cause some diarrhea. Before your exam is started, you will be given a small amount of fluid to drink. Depending on your individual set of symptoms, you may also receive an intravenous injection of x-ray contrast/dye. Plan on being at [facility] for 30 minutes or long, depending on the type of exam you are having performed.  If you have any questions regarding your exam or if you need to reschedule, you may call the CT department at [phone] between the  hours of 8:00 am and 5:00 pm, Monday-Friday.  ________________________________________________________________________

## 2014-07-26 ENCOUNTER — Ambulatory Visit (INDEPENDENT_AMBULATORY_CARE_PROVIDER_SITE_OTHER)
Admission: RE | Admit: 2014-07-26 | Discharge: 2014-07-26 | Disposition: A | Discharge status: Home / Self Care | Payer: BC Managed Care – PPO | Source: Ambulatory Visit | Attending: Physician Assistant | Admitting: Physician Assistant

## 2014-07-26 DIAGNOSIS — K625 Hemorrhage of anus and rectum: Secondary | ICD-10-CM

## 2014-07-26 DIAGNOSIS — R109 Unspecified abdominal pain: Secondary | ICD-10-CM

## 2014-07-26 DIAGNOSIS — R1084 Generalized abdominal pain: Secondary | ICD-10-CM

## 2014-07-26 MED ORDER — IOHEXOL 300 MG/ML  SOLN: 100.0000 mL | Freq: Once | INTRAMUSCULAR | Status: AC | PRN

## 2014-07-29 ENCOUNTER — Telehealth: Payer: Self-pay | Admitting: Gastroenterology

## 2014-07-30 NOTE — Telephone Encounter (Signed)
I have left message for the patient to call back. See result notes from labs and CT

## 2014-10-03 ENCOUNTER — Ambulatory Visit: Payer: BC Managed Care – PPO | Admitting: Family

## 2015-04-15 ENCOUNTER — Other Ambulatory Visit: Payer: Self-pay | Admitting: Gastroenterology

## 2015-08-24 HISTORY — PX: REDUCTION MAMMAPLASTY: SUR839

## 2015-11-03 DIAGNOSIS — N62 Hypertrophy of breast: Secondary | ICD-10-CM | POA: Insufficient documentation

## 2015-11-18 ENCOUNTER — Ambulatory Visit (INDEPENDENT_AMBULATORY_CARE_PROVIDER_SITE_OTHER): Payer: Managed Care, Other (non HMO) | Admitting: Gastroenterology

## 2015-11-18 ENCOUNTER — Encounter: Payer: Self-pay | Admitting: Gastroenterology

## 2015-11-18 VITALS — BP 110/70 | HR 67 | Ht 59.45 in | Wt 153.0 lb

## 2015-11-18 DIAGNOSIS — K5901 Slow transit constipation: Secondary | ICD-10-CM | POA: Diagnosis not present

## 2015-11-18 DIAGNOSIS — K219 Gastro-esophageal reflux disease without esophagitis: Secondary | ICD-10-CM

## 2015-11-18 MED ORDER — ESOMEPRAZOLE MAGNESIUM 40 MG PO CPDR: 40.0000 mg | DELAYED_RELEASE_CAPSULE | Freq: Every day | ORAL | Status: DC

## 2015-11-18 MED ORDER — LINACLOTIDE 145 MCG PO CAPS: 145.0000 ug | ORAL_CAPSULE | Freq: Every day | ORAL | Status: DC

## 2015-11-18 NOTE — Progress Notes (Signed)
[facility] Progress Note  Chief Complaint: Constipation and GERD  Subjective History:  Constipation, tried ducolax, miralax,  Danielle Pace sees me for the first time, she previously saw Dr. Arlyce Dice for functional upper abdominal pain and constipation. A visit with our PA in December 2015 for abdominal pain and rectal bleeding A suspected diagnosis of ischemic colitis. A CT scan just after that was normal, patient's symptoms resolved after a few days. She had a colonoscopy in January 2015 due to family history of colon polyps, this exam was normal. She has a BM 3 or 4 times a week which has been her pattern for years. At times, she feels the need for a BM but cannot pass anything. There is no straining.  She wanted to know if there is anything else to be tried, and especially wondered about Linzess, since she had seen a commercial.  Also, she has years of stable intermittent pyrosis and regurgitation for which she takes as needed Nexium. She denies dysphagia, odynophagia, early satiety or weight loss.  ROS: Cardiovascular:  no chest pain Respiratory: no dyspnea  The patient's Past Medical, Family and Social History were reviewed and are on file in the EMR.  Objective:  Med list reviewed  Vital signs in last 24 hrs: Filed Vitals:   11/18/15 1609  BP: 110/70  Pulse: 67    Physical Exam   HEENT: sclera anicteric, oral mucosa moist without lesions  Neck: supple, no thyromegaly, JVD or lymphadenopathy  Cardiac: RRR without murmurs, S1S2 heard, no peripheral edema  Pulm: clear to auscultation bilaterally, normal RR and effort noted  Abdomen: soft, no tenderness, with active bowel sounds. No guarding or palpable hepatosplenomegaly.  Skin; warm and dry, no jaundice or rash    @ASSESSMENTPLANBEGIN @ Assessment: Encounter Diagnoses  Name Primary?  . Slow transit constipation Yes  . Gastroesophageal reflux disease, esophagitis presence not specified    Chronic stable  symptoms.   Plan: Refill Nexium Diet and lifestyle advice given for GERD and constipation Trial of Linzess 145 g once daily   III

## 2015-11-18 NOTE — Patient Instructions (Signed)
We have given you information to read about fiver and constipation.  Thank you for choosing [facility]  Dr Amada Jupiter III

## 2015-12-01 ENCOUNTER — Other Ambulatory Visit: Payer: Self-pay

## 2015-12-01 DIAGNOSIS — Z1231 Encounter for screening mammogram for malignant neoplasm of breast: Secondary | ICD-10-CM

## 2015-12-08 ENCOUNTER — Ambulatory Visit
Admission: RE | Admit: 2015-12-08 | Discharge: 2015-12-08 | Disposition: A | Discharge status: Home / Self Care | Payer: Managed Care, Other (non HMO) | Source: Ambulatory Visit

## 2015-12-08 DIAGNOSIS — Z1231 Encounter for screening mammogram for malignant neoplasm of breast: Secondary | ICD-10-CM

## 2015-12-16 ENCOUNTER — Other Ambulatory Visit: Payer: Self-pay | Admitting: Plastic Surgery

## 2016-10-22 ENCOUNTER — Telehealth: Payer: Self-pay | Admitting: Gastroenterology

## 2016-10-22 NOTE — Telephone Encounter (Signed)
Pt requesting refill of Nexium 40 mg 1 a day and Linzess 145 mcg one a day. Pt last seen in 10-2015. Please advise.

## 2016-10-25 MED ORDER — ESOMEPRAZOLE MAGNESIUM 40 MG PO CPDR: 40.0000 mg | DELAYED_RELEASE_CAPSULE | Freq: Every day | ORAL | 1 refills | Status: DC

## 2016-10-25 MED ORDER — LINACLOTIDE 145 MCG PO CAPS: 145.0000 ug | ORAL_CAPSULE | Freq: Every day | ORAL | 1 refills | Status: DC

## 2016-10-25 NOTE — Telephone Encounter (Signed)
Pt is already scheduled for a follow up on 11-09-2016. Rx refilled as directed by Dr Myrtie Neither.

## 2016-10-25 NOTE — Telephone Encounter (Signed)
Yes, you may refill both for one month with one refill.  Please have her see me in the next 2 months if wishes future refills.

## 2016-11-09 ENCOUNTER — Ambulatory Visit: Payer: Managed Care, Other (non HMO) | Admitting: Gastroenterology

## 2016-11-10 ENCOUNTER — Other Ambulatory Visit: Payer: Self-pay

## 2017-09-26 ENCOUNTER — Other Ambulatory Visit: Payer: Self-pay | Admitting: Family Medicine

## 2017-09-26 DIAGNOSIS — Z1231 Encounter for screening mammogram for malignant neoplasm of breast: Secondary | ICD-10-CM

## 2017-10-19 ENCOUNTER — Ambulatory Visit
Admission: RE | Admit: 2017-10-19 | Discharge: 2017-10-19 | Disposition: A | Discharge status: Home / Self Care | Payer: BLUE CROSS/BLUE SHIELD | Source: Ambulatory Visit | Attending: Family Medicine | Admitting: Family Medicine

## 2017-10-19 DIAGNOSIS — Z1231 Encounter for screening mammogram for malignant neoplasm of breast: Secondary | ICD-10-CM

## 2017-12-21 ENCOUNTER — Encounter: Payer: Self-pay | Admitting: Podiatry

## 2017-12-21 ENCOUNTER — Other Ambulatory Visit: Payer: Self-pay | Admitting: Podiatry

## 2017-12-21 ENCOUNTER — Ambulatory Visit (INDEPENDENT_AMBULATORY_CARE_PROVIDER_SITE_OTHER): Payer: BLUE CROSS/BLUE SHIELD

## 2017-12-21 ENCOUNTER — Ambulatory Visit: Payer: BLUE CROSS/BLUE SHIELD | Admitting: Podiatry

## 2017-12-21 VITALS — BP 121/70 | HR 69

## 2017-12-21 DIAGNOSIS — M779 Enthesopathy, unspecified: Secondary | ICD-10-CM

## 2017-12-21 DIAGNOSIS — M778 Other enthesopathies, not elsewhere classified: Secondary | ICD-10-CM

## 2017-12-21 DIAGNOSIS — L600 Ingrowing nail: Secondary | ICD-10-CM

## 2017-12-21 DIAGNOSIS — R52 Pain, unspecified: Secondary | ICD-10-CM

## 2017-12-21 MED ORDER — MELOXICAM 15 MG PO TABS: 15.0000 mg | ORAL_TABLET | Freq: Every day | ORAL | 1 refills | Status: AC

## 2017-12-21 NOTE — Patient Instructions (Signed)

## 2017-12-26 NOTE — Progress Notes (Signed)
   Subjective: Patient presents today for evaluation of pain to the lateral border of the right great toe that began several weeks. Patient is concerned for possible ingrown nail. She also reports pain to the plantar aspect of bilateral feet that began two weeks ago. She has tried using OTC inserts and pads with no significant relief. Walking and applying pressure to the toe increases the pain. Patient presents today for further treatment and evaluation.   Past Medical History:  Diagnosis Date  . Arthritis   . GERD (gastroesophageal reflux disease)   . Irritable bowel syndrome   . Meniscus tear    right    Objective:  General: Well developed, nourished, in no acute distress, alert and oriented x3   Dermatology: Skin is warm, dry and supple bilateral. Lateral border of the right great toe appears to be erythematous with evidence of an ingrowing nail. Pain on palpation noted to the border of the nail fold. The remaining nails appear unremarkable at this time. There are no open sores, lesions.  Vascular: Dorsalis Pedis artery and Posterior Tibial artery pedal pulses palpable. No lower extremity edema noted.   Neruologic: Grossly intact via light touch bilateral.  Musculoskeletal: Pain with palpation to the plantar aspects of bilateral feet. Muscular strength within normal limits in all groups bilateral. Normal range of motion noted to all pedal and ankle joints.   Assesement: #1 Paronychia with ingrowing nail lateral border right great toe #2 Pain in toe #3 Incurvated nail #4 Rheumatoid arthritis #5 capsulitis bilateral feet   Plan of Care:  1. Patient evaluated.  2. Discussed treatment alternatives and plan of care. Explained nail avulsion procedure and post procedure course to patient. 3. Patient opted for permanent partial nail avulsion.  4. Prior to procedure, local anesthesia infiltration utilized using 3 ml of a 50:50 mixture of 2% plain lidocaine and 0.5% plain marcaine in a  normal hallux block fashion and a betadine prep performed.  5. Partial permanent nail avulsion with chemical matrixectomy performed using 3x30sec applications of phenol followed by alcohol flush.  6. Light dressing applied. 7. Appointment with [facility] for custom molded orthotics.  8. Prescription for Meloxicam provided to patient.  9. Return to clinic in 2 weeks.   Works at [employer] and is on feet all day.   Felecia Shelling, DPM [facility]  Dr. Felecia Shelling, DPM    [address]                                        [place], [place] [address]                Office [phone]  Fax [phone]

## 2018-01-09 ENCOUNTER — Ambulatory Visit: Payer: BLUE CROSS/BLUE SHIELD | Admitting: Podiatry

## 2018-01-09 ENCOUNTER — Ambulatory Visit (INDEPENDENT_AMBULATORY_CARE_PROVIDER_SITE_OTHER): Payer: BLUE CROSS/BLUE SHIELD | Admitting: Orthotics

## 2018-01-09 DIAGNOSIS — L600 Ingrowing nail: Secondary | ICD-10-CM

## 2018-01-09 DIAGNOSIS — M7750 Other enthesopathy of unspecified foot: Secondary | ICD-10-CM

## 2018-01-09 DIAGNOSIS — M778 Other enthesopathies, not elsewhere classified: Secondary | ICD-10-CM

## 2018-01-09 DIAGNOSIS — M779 Enthesopathy, unspecified: Secondary | ICD-10-CM

## 2018-01-09 NOTE — Progress Notes (Signed)
Patient came into today to be cast for Custom Foot Orthotics. Upon recommendation of Dr. Logan Bores Patient presents with foot pain, esp lateral border of hallus (Ingrown) Goals are arch support, ff cushioning in accommative device.  Plan vendor [facility]

## 2018-01-11 NOTE — Progress Notes (Signed)
   Subjective: Patient presents today 2 weeks post ingrown nail permanent nail avulsion procedure of the lateral border of the right hallux. Patient states that the toe and nail fold is feeling much better. Patient is here for further evaluation and treatment.   Past Medical History:  Diagnosis Date  . Arthritis   . GERD (gastroesophageal reflux disease)   . Irritable bowel syndrome   . Meniscus tear    right    Objective: Skin is warm, dry and supple. Nail and respective nail fold appears to be healing appropriately. Open wound to the associated nail fold with a granular wound base and moderate amount of fibrotic tissue. Minimal drainage noted. Mild erythema around the periungual region likely due to phenol chemical matricectomy.  Assessment: #1 postop permanent partial nail avulsion lateral border of the right hallux #2 open wound periungual nail fold of respective digit.  #3 rheumatoid arthritis  #4 capsulitis bilateral feet  Plan of care: #1 patient was evaluated  #2 debridement of open wound was performed to the periungual border of the respective toe using a currette. Antibiotic ointment and Band-Aid was applied. #3 Continue taking Meloxicam as needed.  #4 Patient already scanned by [facility] for custom molded orthotics. Picks up in the next week or so.  #5 patient is to return to clinic on a PRN basis.  Works at [employer] and is on feet all day.    Felecia Shelling, DPM [facility]  Dr. Felecia Shelling, DPM    [address]                                        [place], [place] [address]                Office [phone]  Fax [phone]

## 2018-01-30 ENCOUNTER — Other Ambulatory Visit: Payer: BLUE CROSS/BLUE SHIELD | Admitting: Orthotics

## 2018-02-14 ENCOUNTER — Ambulatory Visit: Payer: BLUE CROSS/BLUE SHIELD | Admitting: Orthotics

## 2018-02-14 DIAGNOSIS — M778 Other enthesopathies, not elsewhere classified: Secondary | ICD-10-CM

## 2018-02-14 DIAGNOSIS — M779 Enthesopathy, unspecified: Principal | ICD-10-CM

## 2018-02-14 DIAGNOSIS — L600 Ingrowing nail: Secondary | ICD-10-CM

## 2018-02-14 NOTE — Progress Notes (Signed)
Patient came in today to pick up custom made foot orthotics.  The goals were accomplished and the patient reported no dissatisfaction with said orthotics.  Patient was advised of breakin period and how to report any issues. 

## 2018-05-17 ENCOUNTER — Encounter (HOSPITAL_COMMUNITY): Payer: Self-pay | Admitting: *Deleted

## 2018-05-17 ENCOUNTER — Other Ambulatory Visit: Payer: Self-pay

## 2018-05-17 ENCOUNTER — Emergency Department (HOSPITAL_COMMUNITY)
Admission: EM | Admit: 2018-05-17 | Discharge: 2018-05-17 | Disposition: A | Discharge status: Home / Self Care | Payer: BLUE CROSS/BLUE SHIELD | Attending: Emergency Medicine | Admitting: Emergency Medicine

## 2018-05-17 DIAGNOSIS — M79604 Pain in right leg: Secondary | ICD-10-CM

## 2018-05-17 DIAGNOSIS — Z79899 Other long term (current) drug therapy: Secondary | ICD-10-CM | POA: Diagnosis not present

## 2018-05-17 MED ORDER — ENOXAPARIN SODIUM 80 MG/0.8ML ~~LOC~~ SOLN
1.0000 mg/kg | Freq: Once | SUBCUTANEOUS | Status: AC
  Administered 2018-05-17: 65 mg via SUBCUTANEOUS
  Filled 2018-05-17: qty 0.65

## 2018-05-17 NOTE — ED Provider Notes (Signed)
Patient placed in Quick Look pathway, seen and evaluated   Chief Complaint: R leg pain  HPI:   Danielle Pace is a 52 y.o. female with a history of previous right knee surgery, presents to the emergency department for evaluation of ongoing right leg pain worsening over the past 2 months.  She came in today because pain seemed worse than usual after work.  She is noted some mild intermittent swelling in the leg, no numbness or weakness and she is continued to be able to walk around on it without difficulty.  Her orthopedist, Dr. Luiz Blare, did draw some fluid off of the knee and did some steroid injections when she last saw him, but she has not been doing anything else for the leg thus far.  History of PE or DVT, no chest pain or shortness of breath.  ROS: + Leg pain and swelling. -Fevers, numbness, weakness, chest pain, shortness of breath  Physical Exam:   Gen: No distress  Neuro: Awake and Alert  Skin: Warm    Focused Exam: Tenderness to palpation throughout the right leg without focal palpable deformity, there is mild 1+ pitting edema over the lower shin, 2+ DP and PT pulses and sensation intact, normal strength.   Initiation of care has begun. The patient has been counseled on the process, plan, and necessity for staying for the completion/evaluation, and the remainder of the medical screening examination   Legrand Rams 05/17/18 2049    Pricilla Loveless, MD 05/17/18 [phone]

## 2018-05-17 NOTE — Discharge Instructions (Addendum)
Get help right away if: You have new or increased pain, swelling, or redness in an arm or leg. You have numbness or tingling in an arm or leg. You have shortness of breath. You have chest pain. You have a rapid or irregular heartbeat. You feel light-headed or dizzy. You cough up blood. There is blood in your vomit, stool, or urine. You have a serious fall or accident, or you hit your head. You have a severe headache or confusion. You have a cut that will not stop bleeding

## 2018-05-17 NOTE — ED Triage Notes (Signed)
Pt c/o ongoing R leg pain since having surgery on her knee a year and a half ago. Pt has been seeing an orthopedist and was referred to a vein specialist. Mild edema noted to legs, warm to touch, CMS intact. Pt ambulatory with steady gait.

## 2018-05-17 NOTE — ED Provider Notes (Signed)
[facility] EMERGENCY DEPARTMENT Provider Note   CSN: [id number] Arrival date & time: 05/17/18  2010     History   Chief Complaint Chief Complaint  Patient presents with  . Leg Pain    HPI Danielle Pace is a 52 y.o. female who presents emergency department chief complaint of right knee and calf pain.  Patient has chronic right knee pain and varus deformity bilaterally.  He is of previous meniscal tear repair by Dr. Luiz Blare.  2 weeks ago he pulled fluid off her knee and injected it with steroid.  She is continued to have pain.  She points of swelling in the calf and is concerned for potential blood clot.  She has no previous history of DVTs.  She denies chest pain or shortness of breath  HPI  Past Medical History:  Diagnosis Date  . Arthritis   . GERD (gastroesophageal reflux disease)   . Irritable bowel syndrome   . Meniscus tear    right    Patient Active Problem List   Diagnosis Date Noted  . Breast hypertrophy in female 11/03/2015  . Vitamin D deficiency 06/27/2014  . Prediabetes 06/27/2014  . Elevated LDL cholesterol level 06/27/2014  . Dyspepsia and other specified disorders of function of stomach 08/08/2013  . Special screening for malignant neoplasms, colon 08/08/2013    Past Surgical History:  Procedure Laterality Date  . FOOT SURGERY    . OVARY SURGERY       OB History   None      Home Medications    Prior to Admission medications   Medication Sig Start Date End Date Taking? Authorizing Provider  esomeprazole (NEXIUM) 40 MG capsule Take 1 capsule (40 mg total) by mouth daily before breakfast. 10/25/16   Danis, Andreas Blower, MD  ibuprofen (ADVIL,MOTRIN) 200 MG tablet Take 400 mg by mouth every 6 (six) hours as needed for moderate pain. For pain    [provider]  linaclotide (LINZESS) 145 MCG CAPS capsule Take 1 capsule (145 mcg total) by mouth daily before supper. 10/25/16   Sherrilyn Rist, MD  Multiple Vitamin  (MULITIVITAMIN WITH MINERALS) TABS Take 1 tablet by mouth daily.    [provider]  Phenyleph-Doxylamine-DM-APAP (ALKA SELTZER PLUS PO) Take 2 tablets by mouth every 4 (four) hours as needed (flu like symptoms).    [provider]  polycarbophil (FIBERCON) 625 MG tablet Take 625 mg by mouth daily.    [provider]  Polyethylene Glycol 3350 (MIRALAX PO) Take by mouth.    [provider]  vitamin C (ASCORBIC ACID) 500 MG tablet Take 500 mg by mouth daily.    [provider]    Family History Family History  Problem Relation Age of Onset  . Breast cancer Mother   . Diabetes Mother   . Hypertension Mother   . Kidney disease Sister   . Colon polyps Sister        x 2 sisters    Social History Social History   Tobacco Use  . Smoking status: Never Smoker  . Smokeless tobacco: Never Used  Substance Use Topics  . Alcohol use: No  . Drug use: No     Allergies   Omnipaque [iohexol]   Review of Systems Review of Systems  Ten systems reviewed and are negative for acute change, except as noted in the HPI.   Physical Exam Updated Vital Signs BP 131/75 (BP Location: Right Arm)   Pulse 78  Temp 99.2 F (37.3 C) (Oral)   Resp 18   Ht 5' (1.524 m)   Wt 63.5 kg   SpO2 100%   BMI 27.34 kg/m   Physical Exam  Constitutional: She is oriented to person, place, and time. She appears well-developed and well-nourished. No distress.  HENT:  Head: Normocephalic and atraumatic.  Eyes: Pupils are equal, round, and reactive to light. Conjunctivae and EOM are normal. No scleral icterus.  Neck: Normal range of motion.  Cardiovascular: Normal rate, regular rhythm and normal heart sounds. Exam reveals no gallop and no friction rub.  No murmur heard. Pulmonary/Chest: Effort normal and breath sounds normal. No respiratory distress.  Abdominal: Soft. Bowel sounds are normal. She exhibits no distension and no mass. There is no tenderness. There is  no guarding.  Musculoskeletal:  Mild swelling of the right knee as compared to the left.  Tenderness posteriorly in the right calf.  Normal pulse.  Bilateral trace pitting edema.  Neurological: She is alert and oriented to person, place, and time.  Skin: Skin is warm and dry. She is not diaphoretic.  Psychiatric: Her behavior is normal.  Nursing note and vitals reviewed.    ED Treatments / Results  Labs (all labs ordered are listed, but only abnormal results are displayed) Labs Reviewed - No data to display  EKG None  Radiology No results found.  Procedures Procedures (including critical care time)  Medications Ordered in ED Medications  enoxaparin (LOVENOX) injection 65 mg (has no administration in time range)     Initial Impression / Assessment and Plan / ED Course  I have reviewed the triage vital signs and the nursing notes.  Pertinent labs & imaging results that were available during my care of the patient were reviewed by me and considered in my medical decision making (see chart for details).     With concern injection for 12 hours of coverage.  Patient is to return tomorrow morning for a study.  No chest pain shortness of breath.  Heart rate is 78 with normal oxygen saturation.  She is ambulatory and appears appropriate for discharge at this time  Final Clinical Impressions(s) / ED Diagnoses   Final diagnoses:  Right leg pain    ED Discharge Orders         Ordered    LE VENOUS     05/17/18 2315           Arthor Captain, PA-C 05/17/18 2315    Loren Racer, MD 05/21/18 1156

## 2018-05-18 ENCOUNTER — Ambulatory Visit (HOSPITAL_COMMUNITY)
Admission: RE | Admit: 2018-05-18 | Discharge: 2018-05-18 | Disposition: A | Discharge status: Home / Self Care | Payer: BLUE CROSS/BLUE SHIELD | Source: Ambulatory Visit | Attending: Emergency Medicine | Admitting: Emergency Medicine

## 2018-05-18 ENCOUNTER — Ambulatory Visit (HOSPITAL_COMMUNITY): Admission: RE | Admit: 2018-05-18 | Payer: BLUE CROSS/BLUE SHIELD | Source: Ambulatory Visit

## 2018-05-18 DIAGNOSIS — M79661 Pain in right lower leg: Secondary | ICD-10-CM | POA: Diagnosis present

## 2018-05-18 DIAGNOSIS — M79609 Pain in unspecified limb: Secondary | ICD-10-CM | POA: Diagnosis not present

## 2018-05-18 DIAGNOSIS — M7989 Other specified soft tissue disorders: Secondary | ICD-10-CM | POA: Diagnosis not present

## 2018-05-18 NOTE — Progress Notes (Addendum)
Right lower extremity venous duplex has been completed. Negative for DVT.  05/18/18 3:24 PM Olen Cordial RVT

## 2018-10-07 ENCOUNTER — Encounter (HOSPITAL_COMMUNITY): Payer: Self-pay | Admitting: Emergency Medicine

## 2018-10-07 ENCOUNTER — Emergency Department (HOSPITAL_COMMUNITY)
Admission: EM | Admit: 2018-10-07 | Discharge: 2018-10-08 | Disposition: A | Discharge status: Home / Self Care | Payer: BLUE CROSS/BLUE SHIELD | Attending: Emergency Medicine | Admitting: Emergency Medicine

## 2018-10-07 ENCOUNTER — Other Ambulatory Visit: Payer: Self-pay

## 2018-10-07 DIAGNOSIS — Z79899 Other long term (current) drug therapy: Secondary | ICD-10-CM | POA: Insufficient documentation

## 2018-10-07 DIAGNOSIS — R040 Epistaxis: Secondary | ICD-10-CM | POA: Diagnosis present

## 2018-10-07 MED ORDER — OXYMETAZOLINE HCL 0.05 % NA SOLN
1.0000 | Freq: Once | NASAL | Status: AC
  Administered 2018-10-08: 1 via NASAL
  Filled 2018-10-07: qty 30

## 2018-10-07 NOTE — ED Triage Notes (Signed)
Patient states she has been getting nosebleeds for the pass three weeks. She doesn't know why she is bleeding all of a sudden.

## 2018-10-07 NOTE — ED Notes (Signed)
Bed: WA11 Expected date:  Expected time:  Means of arrival:  Comments: 

## 2018-10-08 LAB — CBC WITH DIFFERENTIAL/PLATELET
Abs Immature Granulocytes: 0.02 K/uL (ref 0.00–0.07)
Basophils Absolute: 0.1 K/uL (ref 0.0–0.1)
Basophils Relative: 1 %
Eosinophils Absolute: 0.4 K/uL (ref 0.0–0.5)
Eosinophils Relative: 5 %
HCT: 40.8 % (ref 36.0–46.0)
Hemoglobin: 13.1 g/dL (ref 12.0–15.0)
Immature Granulocytes: 0 %
Lymphocytes Relative: 28 %
Lymphs Abs: 2.3 K/uL (ref 0.7–4.0)
MCH: 29.5 pg (ref 26.0–34.0)
MCHC: 32.1 g/dL (ref 30.0–36.0)
MCV: 91.9 fL (ref 80.0–100.0)
Monocytes Absolute: 0.6 K/uL (ref 0.1–1.0)
Monocytes Relative: 7 %
Neutro Abs: 4.8 K/uL (ref 1.7–7.7)
Neutrophils Relative %: 59 %
Platelets: 268 K/uL (ref 150–400)
RBC: 4.44 MIL/uL (ref 3.87–5.11)
RDW: 13.7 % (ref 11.5–15.5)
WBC: 8.2 K/uL (ref 4.0–10.5)
nRBC: 0 % (ref 0.0–0.2)

## 2018-10-08 LAB — BASIC METABOLIC PANEL
ANION GAP: 7 (ref 5–15)
BUN: 17 mg/dL (ref 6–20)
CO2: 26 mmol/L (ref 22–32)
Calcium: 9.3 mg/dL (ref 8.9–10.3)
Chloride: 106 mmol/L (ref 98–111)
Creatinine, Ser: 0.75 mg/dL (ref 0.44–1.00)
GFR calc Af Amer: >60 mL/min (ref >60)
GFR calc non Af Amer: >60 mL/min (ref >60)
Glucose, Bld: 111 mg/dL — ABNORMAL HIGH (ref 70–99)
Potassium: 3.9 mmol/L (ref 3.5–5.1)
Sodium: 139 mmol/L (ref 135–145)

## 2018-10-08 LAB — PROTIME-INR
INR: 0.86
PROTHROMBIN TIME: 11.7 s (ref 11.4–15.2)

## 2018-10-08 LAB — APTT: aPTT: 28 seconds (ref 24–36)

## 2018-10-08 MED ORDER — SILVER NITRATE-POT NITRATE 75-25 % EX MISC
1.0000 "application " | Freq: Once | CUTANEOUS | Status: AC
  Administered 2018-10-08: 1 via TOPICAL
  Filled 2018-10-08: qty 1

## 2018-10-08 NOTE — ED Provider Notes (Signed)
[facility]-EMERGENCY DEPT Provider Note   CSN: [id number] Arrival date & time: 10/07/18  2150  Time seen 11:45 PM   History   Chief Complaint Chief Complaint  Patient presents with  . Epistaxis    HPI Danielle Pace is a 53 y.o. female.  HPI patient states almost daily for the past 3 weeks especially in the morning she has been having a nosebleed mainly from the left side.  She states she puts pressure on her nose and it goes away in about 10 minutes.  She has been having some sneezing and a mild dry cough.  She denies any fever.  She states they have electric heat in her house and the vents are in the ceiling.  She states she is felt a little bit tired but she is going to school and working.  She denies feeling weak or being on blood thinners.  She states she is never had problems like this before.  Tonight she was at work just prior to coming to the ED and she was bending over to change the trash can bag and she started having the nosebleed again.  She states it lasted about 10 minutes.   PCP Shaune Pollack, MD (Inactive)   Past Medical History:  Diagnosis Date  . Arthritis   . GERD (gastroesophageal reflux disease)   . Irritable bowel syndrome   . Meniscus tear    right    Patient Active Problem List   Diagnosis Date Noted  . Breast hypertrophy in female 11/03/2015  . Vitamin D deficiency 06/27/2014  . Prediabetes 06/27/2014  . Elevated LDL cholesterol level 06/27/2014  . Dyspepsia and other specified disorders of function of stomach 08/08/2013  . Special screening for malignant neoplasms, colon 08/08/2013    Past Surgical History:  Procedure Laterality Date  . FOOT SURGERY    . OVARY SURGERY       OB History   No obstetric history on file.      Home Medications    Prior to Admission medications   Medication Sig Start Date End Date Taking? Authorizing Provider  esomeprazole (NEXIUM) 40 MG capsule Take 1 capsule (40 mg total) by mouth  daily before breakfast. 10/25/16   Danis, Andreas Blower, MD  ibuprofen (ADVIL,MOTRIN) 200 MG tablet Take 400 mg by mouth every 6 (six) hours as needed for moderate pain. For pain    [provider]  linaclotide (LINZESS) 145 MCG CAPS capsule Take 1 capsule (145 mcg total) by mouth daily before supper. 10/25/16   Sherrilyn Rist, MD  Multiple Vitamin (MULITIVITAMIN WITH MINERALS) TABS Take 1 tablet by mouth daily.    [provider]  Phenyleph-Doxylamine-DM-APAP (ALKA SELTZER PLUS PO) Take 2 tablets by mouth every 4 (four) hours as needed (flu like symptoms).    [provider]  polycarbophil (FIBERCON) 625 MG tablet Take 625 mg by mouth daily.    [provider]  Polyethylene Glycol 3350 (MIRALAX PO) Take by mouth.    [provider]  vitamin C (ASCORBIC ACID) 500 MG tablet Take 500 mg by mouth daily.    [provider]    Family History Family History  Problem Relation Age of Onset  . Breast cancer Mother   . Diabetes Mother   . Hypertension Mother   . Kidney disease Sister   . Colon polyps Sister        x 2 sisters    Social History Social History   Tobacco  Use  . Smoking status: Never Smoker  . Smokeless tobacco: Never Used  Substance Use Topics  . Alcohol use: No  . 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    Physical Exam Updated Vital Signs BP 138/82   Pulse 68   Temp 98.4 F (36.9 C) (Oral)   Resp 18   Ht 5' (1.524 m)   Wt 68 kg   SpO2 98%   BMI 29.29 kg/m   Physical Exam Vitals signs and nursing note reviewed.  Constitutional:      Appearance: Normal appearance.  HENT:     Head: Normocephalic and atraumatic.     Right Ear: External ear normal.     Left Ear: External ear normal.     Nose: Nose normal.     Comments: When I inspect her nostril there is no abnormality seen in the  right nostril or on the right nasal septum.  When I inspect the left nostril there is a blood clot seen in the posterior nasal cavity, there are several areas of erythematous changes seen on the left nostril.    Mouth/Throat:     Mouth: Mucous membranes are moist.     Pharynx: Oropharynx is clear.  Eyes:     Extraocular Movements: Extraocular movements intact.     Conjunctiva/sclera: Conjunctivae normal.     Pupils: Pupils are equal, round, and reactive to light.  Neck:     Musculoskeletal: Normal range of motion.  Cardiovascular:     Rate and Rhythm: Normal rate.  Pulmonary:     Effort: Pulmonary effort is normal. No respiratory distress.  Musculoskeletal: Normal range of motion.  Skin:    General: Skin is warm and dry.     Findings: No bruising, erythema or rash.  Neurological:     General: No focal deficit present.     Mental Status: She is alert and oriented to person, place, and time.     Cranial Nerves: No cranial nerve deficit.  Psychiatric:        Mood and Affect: Mood normal.        Behavior: Behavior normal.        Thought Content: Thought content normal.      ED Treatments / Results  Labs (all labs ordered are listed, but only abnormal results are displayed) Results for orders placed or performed during the hospital encounter of 10/07/18  Basic metabolic panel  Result Value Ref Range   Sodium 139 135 - 145 mmol/L   Potassium 3.9 3.5 - 5.1 mmol/L   Chloride 106 98 - 111 mmol/L   CO2 26 22 - 32 mmol/L   Glucose, Bld 111 (H) 70 - 99 mg/dL   BUN 17 6 - 20 mg/dL   Creatinine, Ser 1.610.75 0.44 - 1.00 mg/dL   Calcium 9.3 8.9 - 09.610.3 mg/dL   GFR calc non Af Amer >60 >60 mL/min   GFR calc Af Amer >60 >60 mL/min   Anion gap 7 5 - 15  CBC with Differential  Result Value Ref Range   WBC 8.2 4.0 - 10.5 K/uL   RBC 4.44 3.87 - 5.11 MIL/uL   Hemoglobin 13.1 12.0 - 15.0 g/dL   HCT 04.540.8 40.936.0 - 81.146.0 %   MCV 91.9 80.0 - 100.0 fL   MCH 29.5 26.0 - 34.0 pg   MCHC 32.1 30.0 -  36.0 g/dL   RDW 93.7 16.9 - 67.8 %   Platelets 268 150 - 400 K/uL   nRBC 0.0 0.0 - 0.2 %   Neutrophils Relative % 59 %   Neutro Abs 4.8 1.7 - 7.7 K/uL   Lymphocytes Relative 28 %   Lymphs Abs 2.3 0.7 - 4.0 K/uL   Monocytes Relative 7 %   Monocytes Absolute 0.6 0.1 - 1.0 K/uL   Eosinophils Relative 5 %   Eosinophils Absolute 0.4 0.0 - 0.5 K/uL   Basophils Relative 1 %   Basophils Absolute 0.1 0.0 - 0.1 K/uL   Immature Granulocytes 0 %   Abs Immature Granulocytes 0.02 0.00 - 0.07 K/uL  Protime-INR  Result Value Ref Range   Prothrombin Time 11.7 11.4 - 15.2 seconds   INR 0.86   APTT  Result Value Ref Range   aPTT 28 24 - 36 seconds   Laboratory interpretation all normal except mild nonfasting hyperglycemia    EKG None  Radiology No results found.  Procedures Procedures (including critical care time)  Medications Ordered in ED Medications  oxymetazoline (AFRIN) 0.05 % nasal spray 1 spray (1 spray Left Nare Given 10/08/18 0024)  silver nitrate applicators applicator 1 application (1 application Topical Given by Other 10/08/18 0119)     Initial Impression / Assessment and Plan / ED Course  I have reviewed the triage vital signs and the nursing notes.  Pertinent labs & imaging results that were available during my care of the patient were reviewed by me and considered in my medical decision making (see chart for details).    Laboratory testing was done to look for underlying problem in her clotting mechanisms in her blood, such as thrombocytopenia, abnormal PT/ PTT to suggest coagulopathy.  An Afrin pledget was placed in her left nostril.  We discussed that a lot of times we see this in the winter due to the dry heat in her households.  She states her air vents are in the ceiling so she may need to get a vaporizer or she has a bathroom connected to her bedroom run the shower to get steamy air into her bedroom so it is not so dry.  1:15 AM patient's pledget was removed,  there is minimal blood on the cottonball.  I reinspected her nose and there does seem to be a cluster of excoriated type area in her left nostril.  Silver nitrate was placed on the area after which she started bleeding.  She was advised to hold pressure.  She was discharged home to use the Afrin nasal spray, get more moisture in her bedroom and follow-up with ENT if she continues to have bleeding.  02:05AM the bleeding has stopped  Final Clinical Impressions(s) / ED Diagnoses   Final diagnoses:  Left-sided epistaxis    ED Discharge Orders    None    Afrin  Plan discharge  Devoria Albe, MD, Concha Pyo, MD 10/08/18 [phone]

## 2018-10-08 NOTE — Discharge Instructions (Signed)
Try to get moisture in your bedroom at night before you go to bed.  You can use a vaporizer or if you have a bathroom connected to your bathroom run the shower and leave the door open so your bedroom gets the moisture.  Use the Afrin spray if your nose bleeds and then hold pressure for 10 minutes and the bleeding should stop.  Return to the emergency department for bleeding that will not stop with constant pressure for 5 to 10 minutes.  Otherwise if you continue to have bleeding you should follow-up with Dr. Annalee Genta, the ears nose and throat specialist on call.

## 2018-10-19 DIAGNOSIS — R040 Epistaxis: Secondary | ICD-10-CM | POA: Insufficient documentation

## 2018-10-19 DIAGNOSIS — J342 Deviated nasal septum: Secondary | ICD-10-CM | POA: Insufficient documentation

## 2019-02-20 DIAGNOSIS — J310 Chronic rhinitis: Secondary | ICD-10-CM | POA: Insufficient documentation

## 2019-02-20 DIAGNOSIS — J029 Acute pharyngitis, unspecified: Secondary | ICD-10-CM | POA: Insufficient documentation

## 2019-02-20 MED FILL — FLUTICASONE PROP 50 MCG SPR: 50 | 30 days supply | Qty: 16 | Fill #0

## 2019-03-27 ENCOUNTER — Other Ambulatory Visit: Payer: Self-pay | Admitting: Obstetrics and Gynecology

## 2019-03-27 DIAGNOSIS — Z1231 Encounter for screening mammogram for malignant neoplasm of breast: Secondary | ICD-10-CM

## 2019-03-30 ENCOUNTER — Other Ambulatory Visit: Payer: Self-pay

## 2019-03-30 ENCOUNTER — Ambulatory Visit
Admission: RE | Admit: 2019-03-30 | Discharge: 2019-03-30 | Disposition: A | Discharge status: Home / Self Care | Payer: No Typology Code available for payment source | Source: Ambulatory Visit | Attending: Obstetrics and Gynecology | Admitting: Obstetrics and Gynecology

## 2019-03-30 DIAGNOSIS — Z1231 Encounter for screening mammogram for malignant neoplasm of breast: Secondary | ICD-10-CM

## 2019-10-22 MED FILL — traMADol HCL 50 MG TABS: 50 | 10 days supply | Qty: 30 | Fill #0

## 2019-11-02 ENCOUNTER — Encounter (HOSPITAL_COMMUNITY): Payer: Self-pay | Admitting: Emergency Medicine

## 2019-11-02 ENCOUNTER — Emergency Department (HOSPITAL_COMMUNITY): Payer: No Typology Code available for payment source

## 2019-11-02 ENCOUNTER — Other Ambulatory Visit: Payer: Self-pay

## 2019-11-02 ENCOUNTER — Emergency Department (HOSPITAL_COMMUNITY)
Admission: EM | Admit: 2019-11-02 | Discharge: 2019-11-02 | Disposition: A | Discharge status: Home / Self Care | Payer: No Typology Code available for payment source | Attending: Emergency Medicine | Admitting: Emergency Medicine

## 2019-11-02 DIAGNOSIS — Z79899 Other long term (current) drug therapy: Secondary | ICD-10-CM | POA: Diagnosis not present

## 2019-11-02 DIAGNOSIS — M25572 Pain in left ankle and joints of left foot: Secondary | ICD-10-CM | POA: Insufficient documentation

## 2019-11-02 DIAGNOSIS — M25561 Pain in right knee: Secondary | ICD-10-CM | POA: Insufficient documentation

## 2019-11-02 MED ORDER — PREDNISONE 20 MG PO TABS
60.0000 mg | ORAL_TABLET | Freq: Once | ORAL | Status: AC
  Administered 2019-11-02: 19:00:00 60 mg via ORAL
  Filled 2019-11-02: qty 3

## 2019-11-02 MED ORDER — PREDNISONE 10 MG PO TABS: 20.0000 mg | ORAL_TABLET | Freq: Two times a day (BID) | ORAL | 0 refills | Status: AC

## 2019-11-02 NOTE — ED Triage Notes (Signed)
Pt endorses right knee pain, and left foot and ankle pain for a few months. Denies any injury.

## 2019-11-02 NOTE — Discharge Instructions (Addendum)
1. Medications: Take steroid taper as prescribed with food to avoid upset stomach issues.  Do not take meloxicam, ibuprofen, Advil, Aleve, or Motrin while taking this medicine.  You may take [phone] mg of Tylenol every 6 hours as needed for pain. Do not exceed 4000 mg of Tylenol daily.   2. Treatment: rest, ice, elevate at home, wear compression stockings while at work, drink plenty of fluids, gentle stretching (see attached exercises, but you can also YouTube search physical therapy exercises).   3. Follow Up: Please followup with orthopedics as directed or your PCP in 1 week if no improvement for discussion of your diagnoses and further evaluation after today's visit; if you do not have a primary care doctor use the resource guide provided to find one; Please return to the ER for worsening symptoms or other concerns such as worsening swelling, redness of the skin, fevers, loss of pulses, or loss of feeling

## 2019-11-02 NOTE — ED Notes (Signed)
Patient verbalizes understanding of discharge instructions. Opportunity for questioning and answers were provided. Pt discharged from ED. 

## 2019-11-02 NOTE — ED Provider Notes (Signed)
[facility] EMERGENCY DEPARTMENT Provider Note   CSN: [id number] Arrival date & time: 11/02/19  1555     History Chief Complaint  Patient presents with  . Knee Pain    Danielle Pace is a 54 y.o. female with history of arthritis, GERD, IBS presents for evaluation of acute onset, intermittent and persistent right ankle and left foot pains for several months.  Denies any known history of trauma.  Reports that she was told that she has rheumatoid arthritis but does not think that this was ever confirmed.  She has not been seen by rheumatologist in several years.  She does have a history of flatfeet and says has seen Dr. Logan Bores with podiatry previously and was also noted to have a history of capsulitis of both feet.  She takes meloxicam for joint pains which she reports is helpful but not as helpful when she is working and active.  Describes the pain as aching, worse along the inferior aspect of the right knee and the medial aspect of the left ankle.  Denies numbness or weakness of the extremities.  Denies fevers.  She does note some swelling of multiple joints but states this is baseline for her.  The history is provided by the patient and medical records.       Past Medical History:  Diagnosis Date  . Arthritis   . GERD (gastroesophageal reflux disease)   . Irritable bowel syndrome   . Meniscus tear    right    Patient Active Problem List   Diagnosis Date Noted  . Breast hypertrophy in female 11/03/2015  . Vitamin D deficiency 06/27/2014  . Prediabetes 06/27/2014  . Elevated LDL cholesterol level 06/27/2014  . Dyspepsia and other specified disorders of function of stomach 08/08/2013  . Special screening for malignant neoplasms, colon 08/08/2013    Past Surgical History:  Procedure Laterality Date  . FOOT SURGERY    . OVARY SURGERY    . REDUCTION MAMMAPLASTY  2017     OB History   No obstetric history on file.     Family History  Problem Relation  Age of Onset  . Breast cancer Mother   . Diabetes Mother   . Hypertension Mother   . Kidney disease Sister   . Colon polyps Sister        x 2 sisters    Social History   Tobacco Use  . Smoking status: Never Smoker  . Smokeless tobacco: Never Used  Substance Use Topics  . Alcohol use: No  . Drug use: No    Home Medications Prior to Admission medications   Medication Sig Start Date End Date Taking? Authorizing Provider  esomeprazole (NEXIUM) 40 MG capsule Take 1 capsule (40 mg total) by mouth daily before breakfast. 10/25/16   Danis, Andreas Blower, MD  ibuprofen (ADVIL,MOTRIN) 200 MG tablet Take 400 mg by mouth every 6 (six) hours as needed for moderate pain. For pain    [provider]  linaclotide (LINZESS) 145 MCG CAPS capsule Take 1 capsule (145 mcg total) by mouth daily before supper. 10/25/16   Sherrilyn Rist, MD  Multiple Vitamin (MULITIVITAMIN WITH MINERALS) TABS Take 1 tablet by mouth daily.    [provider]  Phenyleph-Doxylamine-DM-APAP (ALKA SELTZER PLUS PO) Take 2 tablets by mouth every 4 (four) hours as needed (flu like symptoms).    [provider]  polycarbophil (FIBERCON) 625 MG tablet Take 625 mg by mouth daily.    [provider]  Polyethylene Glycol 3350 (MIRALAX PO) Take by mouth.    [provider]  predniSONE (DELTASONE) 10 MG tablet Take 2 tablets (20 mg total) by mouth 2 (two) times daily with a meal for 5 days. 11/02/19 11/07/19  Rodell Perna A, PA-C  vitamin C (ASCORBIC ACID) 500 MG tablet Take 500 mg by mouth daily.    [provider]    Allergies    Omnipaque [iohexol]  Review of Systems   Review of Systems  Constitutional: Negative for fever.  Musculoskeletal: Positive for arthralgias.  Neurological: Negative for weakness and numbness.    Physical Exam Updated Vital Signs BP 125/72   Pulse 79   Temp 98.7 F (37.1 C) (Oral)   Resp 16   Ht 4\' 11"  (1.499 m)   Wt 63.5 kg   SpO2 99%   BMI  28.28 kg/m   Physical Exam Vitals and nursing note reviewed.  Constitutional:      General: She is not in acute distress.    Appearance: She is well-developed.  HENT:     Head: Normocephalic and atraumatic.  Eyes:     General:        Right eye: No discharge.        Left eye: No discharge.     Conjunctiva/sclera: Conjunctivae normal.  Neck:     Vascular: No JVD.     Trachea: No tracheal deviation.  Cardiovascular:     Rate and Rhythm: Normal rate and regular rhythm.     Pulses: Normal pulses.     Comments: 2+ DP/PT pulses bilaterally, Homans sign absent bilaterally, no lower extremity edema, no palpable cords, compartments are soft  Pulmonary:     Effort: Pulmonary effort is normal.  Abdominal:     General: There is no distension.  Musculoskeletal:        General: Tenderness present.     Comments: Tenderness to palpation of the inferior aspect of the right knee and lateral joint line.  No ligamentous laxity or varus or valgus instability.  Negative anterior/posterior drawer test.  Normal activity depression.  Mild tenderness to palpation of the medial aspect of the left ankle.  No ligamentous laxity noted.  Examination of the Achilles tendon is within normal limits.  No erythema or warmth to the joints.  5/5 strength of BLE major muscle groups.  Postsurgical changes noted to the bilateral great toes.  Flat feet bilaterally.  Skin:    General: Skin is warm and dry.     Capillary Refill: Capillary refill takes less than 2 seconds.     Findings: No erythema.  Neurological:     Mental Status: She is alert.     Comments: Sensation intact to light touch of bilateral lower extremities.  Psychiatric:        Behavior: Behavior normal.     ED Results / Procedures / Treatments   Labs (all labs ordered are listed, but only abnormal results are displayed) Labs Reviewed - No data to display  EKG None  Radiology DG Ankle Complete Left  Result Date: 11/02/2019 CLINICAL  DATA:  Left ankle pain, initial encounter EXAM: LEFT ANKLE COMPLETE - 3+ VIEW COMPARISON:  None. FINDINGS: Generalized soft tissue swelling is noted. No acute fracture or dislocation is noted. IMPRESSION: Soft tissue swelling without acute bony abnormality. Electronically Signed   By: Inez Catalina M.D.   On: 11/02/2019 18:03   DG Knee Complete 4 Views Right  Result Date: 11/02/2019 CLINICAL DATA:  Right knee  pain, no known injury, initial encounter EXAM: RIGHT KNEE - COMPLETE 4+ VIEW COMPARISON:  None. FINDINGS: Mild degenerative changes of the knee joint are seen. No acute fracture or dislocation is noted. No soft tissue abnormality is noted. IMPRESSION: Degenerative change without acute abnormality. Electronically Signed   By: Alcide Clever M.D.   On: 11/02/2019 18:04    Procedures Procedures (including critical care time)  Medications Ordered in ED Medications  predniSONE (DELTASONE) tablet 60 mg (60 mg Oral Given 11/02/19 1849)    ED Course  I have reviewed the triage vital signs and the nursing notes.  Pertinent labs & imaging results that were available during my care of the patient were reviewed by me and considered in my medical decision making (see chart for details).    MDM Rules/Calculators/A&P                      Patient presenting for evaluation of left ankle and right knee pain that have been ongoing for several months.  Reportedly has a history of rheumatoid arthritis.  She is afebrile, vital signs are stable.  She is nontoxic in appearance.  She is neurovascularly intact and compartments are soft.  Ambulatory in the ED without difficulty.  Radiographs shows some mild degenerative changes in the knee and soft tissue swelling in the ankle but no acute abnormalities.  No concern for DVT, septic arthritis, osteomyelitis, or secondary skin infection.  Discussed conservative therapy and management, will discharge with steroid burst.  She has an orthopedist with whom she can follow-up  and I encouraged her to do so.  Discussed strict ED return precautions. Patient verbalized understanding of and agreement with plan and is safe for discharge home at this time.   Final Clinical Impression(s) / ED Diagnoses Final diagnoses:  Acute pain of right knee  Acute left ankle pain    Rx / DC Orders ED Discharge Orders         Ordered    predniSONE (DELTASONE) 10 MG tablet  2 times daily with meals     11/02/19 1844           Jeanie Sewer, PA-C 11/03/19 0914    Maia Plan, MD 11/03/19 [phone]

## 2019-11-03 MED FILL — predniSONE 10 MG TABS: 10 | 5 days supply | Qty: 20 | Fill #0

## 2020-01-02 ENCOUNTER — Ambulatory Visit: Payer: No Typology Code available for payment source | Admitting: Internal Medicine

## 2020-01-17 ENCOUNTER — Telehealth: Payer: Self-pay | Admitting: Internal Medicine

## 2020-01-17 ENCOUNTER — Other Ambulatory Visit: Payer: Self-pay | Admitting: Orthopedic Surgery

## 2020-01-17 NOTE — Telephone Encounter (Signed)
Asher Muir with [facility] called in stated that they will be faxing over a surgical clearance form for Dr. Ardyth Harps to complete and they are aware that the pt will be a newpt on Friday 01/25/2020.  Pt will be having a total R knee arthroplasty done on 02/13/2020.

## 2020-01-17 NOTE — Telephone Encounter (Signed)
Noted. Fax received.

## 2020-01-24 ENCOUNTER — Other Ambulatory Visit: Payer: Self-pay

## 2020-01-25 ENCOUNTER — Ambulatory Visit (INDEPENDENT_AMBULATORY_CARE_PROVIDER_SITE_OTHER): Payer: No Typology Code available for payment source | Admitting: Internal Medicine

## 2020-01-25 ENCOUNTER — Encounter: Payer: Self-pay | Admitting: Internal Medicine

## 2020-01-25 VITALS — BP 120/70 | HR 80 | Temp 97.6°F | Ht 59.0 in | Wt 161.5 lb

## 2020-01-25 DIAGNOSIS — E669 Obesity, unspecified: Secondary | ICD-10-CM | POA: Diagnosis not present

## 2020-01-25 DIAGNOSIS — Z1283 Encounter for screening for malignant neoplasm of skin: Secondary | ICD-10-CM

## 2020-01-25 DIAGNOSIS — M25569 Pain in unspecified knee: Secondary | ICD-10-CM | POA: Diagnosis not present

## 2020-01-25 DIAGNOSIS — Z01818 Encounter for other preprocedural examination: Secondary | ICD-10-CM

## 2020-01-25 DIAGNOSIS — M069 Rheumatoid arthritis, unspecified: Secondary | ICD-10-CM | POA: Insufficient documentation

## 2020-01-25 DIAGNOSIS — J309 Allergic rhinitis, unspecified: Secondary | ICD-10-CM

## 2020-01-25 DIAGNOSIS — M79673 Pain in unspecified foot: Secondary | ICD-10-CM

## 2020-01-25 LAB — COMPREHENSIVE METABOLIC PANEL
ALT: 21 U/L (ref 0–35)
AST: 17 U/L (ref 0–37)
Albumin: 4.3 g/dL (ref 3.5–5.2)
Alkaline Phosphatase: 65 U/L (ref 39–117)
BUN: 16 mg/dL (ref 6–23)
CO2: 29 mEq/L (ref 19–32)
Calcium: 9.7 mg/dL (ref 8.4–10.5)
Chloride: 102 mEq/L (ref 96–112)
Creatinine, Ser: 0.78 mg/dL (ref 0.40–1.20)
GFR: 92.66 mL/min (ref >60.00)
Glucose, Bld: 81 mg/dL (ref 70–99)
Potassium: 3.7 mEq/L (ref 3.5–5.1)
Sodium: 139 mEq/L (ref 135–145)
Total Bilirubin: 0.4 mg/dL (ref 0.2–1.2)
Total Protein: 7.5 g/dL (ref 6.0–8.3)

## 2020-01-25 LAB — CBC WITH DIFFERENTIAL/PLATELET
Basophils Absolute: 0.1 10*3/uL (ref 0.0–0.1)
Basophils Relative: 0.8 % (ref 0.0–3.0)
Eosinophils Absolute: 0.2 10*3/uL (ref 0.0–0.7)
Eosinophils Relative: 3 % (ref 0.0–5.0)
HCT: 39.6 % (ref 36.0–46.0)
Hemoglobin: 13.4 g/dL (ref 12.0–15.0)
Lymphocytes Relative: 23.1 % (ref 12.0–46.0)
Lymphs Abs: 1.8 10*3/uL (ref 0.7–4.0)
MCHC: 33.8 g/dL (ref 30.0–36.0)
MCV: 89.7 fl (ref 78.0–100.0)
Monocytes Absolute: 0.6 10*3/uL (ref 0.1–1.0)
Monocytes Relative: 7.1 % (ref 3.0–12.0)
Neutro Abs: 5.3 10*3/uL (ref 1.4–7.7)
Neutrophils Relative %: 66 % (ref 43.0–77.0)
Platelets: 263 10*3/uL (ref 150.0–400.0)
RBC: 4.42 Mil/uL (ref 3.87–5.11)
RDW: 13.6 % (ref 11.5–15.5)
WBC: 8 10*3/uL (ref 4.0–10.5)

## 2020-01-25 LAB — TSH: TSH: 1.98 u[IU]/mL (ref 0.35–4.50)

## 2020-01-25 NOTE — Patient Instructions (Signed)
-  Nice seeing you today!!  -Lab work today; will notify you once results are available.  -See you back in 3 months for your physical. Please come in fasting that day. 

## 2020-01-25 NOTE — Progress Notes (Signed)
New Patient Office Visit     This visit occurred during the SARS-CoV-2 public health emergency.  Safety protocols were in place, including screening questions prior to the visit, additional usage of staff PPE, and extensive cleaning of exam room while observing appropriate contact time as indicated for disinfecting solutions.    CC/Reason for Visit: Establish care, preoperative clearance Previous PCP: Darcus Austin, MD Last Visit: 2 to 3 years ago  HPI: Danielle Pace is a 54 y.o. female who is coming in today for the above mentioned reasons. Past Medical History is significant for: Rheumatoid arthritis who is no longer following with rheumatologist.  She also has significant right knee arthritis and is scheduled for knee replacement later this month.  She is here to establish care but mainly for preoperative clearance.  She works as an Armed forces logistics/support/administrative officer at [employer], she does not smoke, she does not drink she has no drug allergies except for contrast dye which causes hives.  Her past surgical history is significant for breast reduction in 2018.  Her family history is significant for mother with diabetes, hyperlipidemia, and arrhythmia status post pacemaker and a history of breast cancer.  She is overdue on health maintenance items.  She does not have chest pain or shortness of breath with activity.  Activity is only limited by her knee pain.   Past Medical/Surgical History: Past Medical History:  Diagnosis Date  . Arthritis   . GERD (gastroesophageal reflux disease)   . Irritable bowel syndrome   . Meniscus tear    right  . RA (rheumatoid arthritis) ([place])     Past Surgical History:  Procedure Laterality Date  . FOOT SURGERY    . OVARY SURGERY    . REDUCTION MAMMAPLASTY  2017    Social History:  reports that she has never smoked. She has never used smokeless tobacco. She reports that she does not drink alcohol or use drugs.  Allergies: Allergies  Allergen  Reactions  . Omnipaque [Iohexol] Hives and Itching    Family History:  Family History  Problem Relation Age of Onset  . Breast cancer Mother   . Diabetes Mother   . Hypertension Mother   . Kidney disease Sister   . Colon polyps Sister        x 2 sisters     Current Outpatient Medications:  .  acetaminophen (TYLENOL) 650 MG CR tablet, Take 650-1,300 mg by mouth every 8 (eight) hours as needed for pain., Disp: , Rfl:  .  ASPERCREME LIDOCAINE EX, Apply 1 application topically 3 (three) times daily as needed (pain.)., Disp: , Rfl:  .  aspirin-acetaminophen-caffeine (EXCEDRIN MIGRAINE) 250-250-65 MG tablet, Take 1 tablet by mouth every 6 (six) hours as needed for headache., Disp: , Rfl:  .  aspirin-sod bicarb-citric acid (ALKA-SELTZER) 325 MG TBEF tablet, Take 325 mg by mouth every 6 (six) hours as needed (upset stomach)., Disp: , Rfl:  .  fluticasone (FLONASE) 50 MCG/ACT nasal spray, Place 1-2 sprays into both nostrils daily as needed for allergies or rhinitis., Disp: , Rfl:  .  ibuprofen (ADVIL,MOTRIN) 200 MG tablet, Take 400 mg by mouth every 6 (six) hours as needed for moderate pain. For pain, Disp: , Rfl:  .  Melatonin 2.5 MG CHEW, Chew 5 mg by mouth at bedtime as needed (sleep.)., Disp: , Rfl:  .  meloxicam (MOBIC) 15 MG tablet, Take 15 mg by mouth daily as needed for pain., Disp: , Rfl:  .  Menthol, Topical  Analgesic, (BIOFREEZE EX), Apply 1 application topically 3 (three) times daily as needed (pain.)., Disp: , Rfl:  .  Multiple Vitamin (MULITIVITAMIN WITH MINERALS) TABS, Take 1 tablet by mouth daily. Alive Women's Multivitamin 50+, Disp: , Rfl:  .  Naphazoline-Pheniramine (OPCON-A) 0.027-0.315 % SOLN, Place 1-2 drops into both eyes 3 (three) times daily as needed (dry/irritated eyes.)., Disp: , Rfl:  .  Phenyleph-Doxylamine-DM-APAP (ALKA SELTZER PLUS PO), Take 2 tablets by mouth every 4 (four) hours as needed (cold symptoms.). , Disp: , Rfl:  .  Senna (SENOKOT LAXATIVE GUMMIES PO),  Take 1 tablet by mouth daily as needed (constipation.)., Disp: , Rfl:  .  traMADol (ULTRAM) 50 MG tablet, Take 50 mg by mouth every 8 (eight) hours as needed for pain., Disp: , Rfl:  .  vitamin B-12 (CYANOCOBALAMIN) 500 MCG tablet, Take 500 mcg by mouth daily., Disp: , Rfl:  .  vitamin C (ASCORBIC ACID) 500 MG tablet, Take 500 mg by mouth 2 (two) times a week. , Disp: , Rfl:   Review of Systems:  Constitutional: Denies fever, chills, diaphoresis, appetite change and fatigue.  HEENT: Denies photophobia, eye pain, redness, hearing loss, ear pain, congestion, sore throat, rhinorrhea, sneezing, mouth sores, trouble swallowing, neck pain, neck stiffness and tinnitus.   Respiratory: Denies SOB, DOE, cough, chest tightness,  and wheezing.   Cardiovascular: Denies chest pain, palpitations and leg swelling.  Gastrointestinal: Denies nausea, vomiting, abdominal pain, diarrhea, constipation, blood in stool and abdominal distention.  Genitourinary: Denies dysuria, urgency, frequency, hematuria, flank pain and difficulty urinating.  Endocrine: Denies: hot or cold intolerance, sweats, changes in hair or nails, polyuria, polydipsia. Musculoskeletal: Denies myalgias, back pain. Skin: Denies pallor, rash and wound.  Neurological: Denies dizziness, seizures, syncope, weakness, light-headedness, numbness and headaches.  Hematological: Denies adenopathy. Easy bruising, personal or family bleeding history  Psychiatric/Behavioral: Denies suicidal ideation, mood changes, confusion, nervousness, sleep disturbance and agitation    Physical Exam: Vitals:   01/25/20 1107  BP: 120/70  Pulse: 80  Temp: 97.6 F (36.4 C)  TempSrc: Temporal  SpO2: 97%  Weight: 161 lb 8 oz (73.3 kg)  Height: 4\' 11"  (1.499 m)   Body mass index is 32.62 kg/m.   Constitutional: NAD, calm, comfortable Eyes: PERRL, lids and conjunctivae normal ENMT: Mucous membranes are moist.  Respiratory: clear to auscultation bilaterally, no  wheezing, no crackles. Normal respiratory effort. No accessory muscle use.  Cardiovascular: Regular rate and rhythm, no murmurs / rubs / gallops. No extremity edema.   Neurologic: Grossly intact and nonfocal Psychiatric: Normal judgment and insight. Alert and oriented x 3. Normal mood.    Impression and Plan:  Preop examination  -EKG done in office today and interpreted by myself shows sinus rhythm at a rate of 70, normal axis no acute ST or T wave changes. -She has no history of hypertension and diabetes, vital signs are within normal limits today. -Check labs mainly for renal function, if normal will clear for surgery without further work-up.  Rheumatoid arthritis involving multiple sites, unspecified whether rheumatoid factor present [facility]) -She will institute care with dermatologist, have offered referral.  Obesity (BMI 30.0-34.9) -Discussed healthy lifestyle, including increased physical activity and better food choices to promote weight loss.     Patient Instructions  -Nice seeing you today!!  -Lab work today; will notify you once results are available.  -See you back in 3 months for your physical. Please come in fasting that day.     [birth year]1992, MD [facility]

## 2020-02-05 NOTE — Patient Instructions (Addendum)
DUE TO COVID-19 ONLY ONE VISITOR IS ALLOWED TO COME WITH YOU AND STAY IN THE WAITING ROOM ONLY DURING PRE OP AND PROCEDURE DAY OF SURGERY. THE 2 VISITORS  MAY VISIT WITH YOU AFTER SURGERY IN YOUR PRIVATE ROOM DURING VISITING HOURS ONLY!  YOU NEED TO HAVE A COVID 19 TEST ON   6/22_ @_10 :30______, THIS TEST MUST BE DONE BEFORE SURGERY, COME  [place] , [id number].  ([place]) ONCE YOUR COVID TEST IS COMPLETED, PLEASE BEGIN THE QUARANTINE INSTRUCTIONS AS OUTLINED IN YOUR HANDOUT.                Danielle Pace    Your procedure is scheduled on: 02/15/20   Report to [facility] Main  Entrance   Report to admitting at  7:10 AM     Call this number if you have problems the morning of surgery [place], NO CHEWING GUM [place].   Do not eat food After Midnight.   YOU MAY HAVE CLEAR LIQUIDS FROM MIDNIGHT UNTIL 6:30 AM.   At 6:30 AM Please finish the prescribed Pre-Surgery  drink  . Nothing by mouth after you finish the Gatorade drink !   Take these medicines the morning of surgery with A SIP OF WATER: Flonase if needed, eye drops                                 You may not have any metal on your body including hair pins and              piercings  Do not wear jewelry, make-up, lotions, powders or perfumes, deodorant             Do not wear nail polish on your fingernails.              Do not shave  48 hours prior to surgery.           Do not bring valuables to the hospital. [place].  Contacts, dentures or bridgework may not be worn into surgery.       Patients discharged the day of surgery will not be allowed to drive home.   IF YOU ARE HAVING SURGERY AND GOING HOME THE SAME DAY, YOU MUST HAVE AN ADULT TO DRIVE YOU HOME AND BE WITH YOU FOR 24 HOURS.   YOU MAY GO HOME BY TAXI OR UBER OR ORTHERWISE, BUT AN  ADULT MUST ACCOMPANY YOU HOME AND STAY WITH YOU FOR 24 HOURS.  Name and phone number of your driver:  Special Instructions: N/A              Please read over the following fact sheets you were given: _____________________________________________________________________             [facility] - Preparing for Surgery  Before surgery, you can play an important role.   Because skin is not sterile, your skin needs to be as free of germs as possible.   You can reduce the number of germs on your skin by washing with CHG (chlorahexidine gluconate) soap before surgery.   CHG is an antiseptic cleaner which kills germs and bonds with the skin to continue killing germs even after  washing. Please DO NOT use if you have an allergy to CHG or antibacterial soaps.   If your skin becomes reddened/irritated stop using the CHG and inform your nurse when you arrive at Short Stay. Do not shave (including legs and underarms) for at least 48 hours prior to the first CHG shower.   Please follow these instructions carefully:  1.  Shower with CHG Soap the night before surgery and the  morning of Surgery.  2.  If you choose to wash your hair, wash your hair first as usual with your  normal  shampoo.  3.  After you shampoo, rinse your hair and body thoroughly to remove the  shampoo.                                         4.  Use CHG as you would any other liquid soap.  You can apply chg directly  to the skin and wash                       Gently with a scrungie or clean washcloth.  5.  Apply the CHG Soap to your body ONLY FROM THE NECK DOWN.   Do not use on face/ open                           Wound or open sores. Avoid contact with eyes, ears mouth and genitals (private parts).                       Wash face,  Genitals (private parts) with your normal soap.             6.  Wash thoroughly, paying special attention to the area where your surgery  will be performed.  7.  Thoroughly rinse your body with warm water  from the neck down.  8.  DO NOT shower/wash with your normal soap after using and rinsing off  the CHG Soap.             9.  Pat yourself dry with a clean towel.            10.  Wear clean pajamas.            11.  Place clean sheets on your bed the night of your first shower and do not  sleep with pets. Day of Surgery : Do not apply any lotions/deodorants the morning of surgery.  Please wear clean clothes to the hospital/surgery center.  FAILURE TO FOLLOW THESE INSTRUCTIONS MAY RESULT IN THE CANCELLATION OF YOUR SURGERY PATIENT SIGNATURE_________________________________  NURSE SIGNATURE__________________________________  ________________________________________________________________________   Danielle Pace  An incentive spirometer is a tool that can help keep your lungs clear and active. This tool measures how well you are filling your lungs with each breath. Taking long deep breaths may help reverse or decrease the chance of developing breathing (pulmonary) problems (especially infection) following:  A long period of time when you are unable to move or be active. BEFORE THE PROCEDURE   If the spirometer includes an indicator to show your best effort, your nurse or respiratory therapist will set it to a desired goal.  If possible, sit up straight or lean slightly forward. Try not to slouch.  Hold the incentive spirometer in an upright position. INSTRUCTIONS FOR USE  1. Sit on the edge of your bed if possible, or sit up as far as you can in bed or on a chair. 2. Hold the incentive spirometer in an upright position. 3. Breathe out normally. 4. Place the mouthpiece in your mouth and seal your lips tightly around it. 5. Breathe in slowly and as deeply as possible, raising the piston or the ball toward the top of the column. 6. Hold your breath for 3-5 seconds or for as long as possible. Allow the piston or ball to fall to the bottom of the column. 7. Remove the mouthpiece from  your mouth and breathe out normally. 8. Rest for a few seconds and repeat Steps 1 through 7 at least 10 times every 1-2 hours when you are awake. Take your time and take a few normal breaths between deep breaths. 9. The spirometer may include an indicator to show your best effort. Use the indicator as a goal to work toward during each repetition. 10. After each set of 10 deep breaths, practice coughing to be sure your lungs are clear. If you have an incision (the cut made at the time of surgery), support your incision when coughing by placing a pillow or rolled up towels firmly against it. Once you are able to get out of bed, walk around indoors and cough well. You may stop using the incentive spirometer when instructed by your caregiver.  RISKS AND COMPLICATIONS  Take your time so you do not get dizzy or light-headed.  If you are in pain, you may need to take or ask for pain medication before doing incentive spirometry. It is harder to take a deep breath if you are having pain. AFTER USE  Rest and breathe slowly and easily.  It can be helpful to keep track of a log of your progress. Your caregiver can provide you with a simple table to help with this. If you are using the spirometer at home, follow these instructions: SEEK MEDICAL CARE IF:   You are having difficultly using the spirometer.  You have trouble using the spirometer as often as instructed.  Your pain medication is not giving enough relief while using the spirometer.  You develop fever of 100.5 F (38.1 C) or higher. SEEK IMMEDIATE MEDICAL CARE IF:   You cough up bloody sputum that had not been present before.  You develop fever of 102 F (38.9 C) or greater.  You develop worsening pain at or near the incision site. MAKE SURE YOU:   Understand these instructions.  Will watch your condition.  Will get help right away if you are not doing well or get worse. Document Released: 12/20/2006 Document Revised: 11/01/2011  Document Reviewed: 02/20/2007 [facility] Patient Information 2014 [place], [place].   ________________________________________________________________________

## 2020-02-06 ENCOUNTER — Encounter (HOSPITAL_COMMUNITY)
Admission: RE | Admit: 2020-02-06 | Discharge: 2020-02-06 | Disposition: A | Discharge status: Home / Self Care | Payer: No Typology Code available for payment source | Source: Ambulatory Visit | Attending: Orthopedic Surgery | Admitting: Orthopedic Surgery

## 2020-02-06 ENCOUNTER — Encounter (HOSPITAL_COMMUNITY): Payer: Self-pay | Admitting: *Deleted

## 2020-02-06 ENCOUNTER — Other Ambulatory Visit: Payer: Self-pay

## 2020-02-06 DIAGNOSIS — Z01811 Encounter for preprocedural respiratory examination: Secondary | ICD-10-CM | POA: Diagnosis not present

## 2020-02-06 HISTORY — DX: Nausea with vomiting, unspecified: R11.2

## 2020-02-06 HISTORY — DX: Other complications of anesthesia, initial encounter: T88.59XA

## 2020-02-06 HISTORY — DX: Other specified postprocedural states: Z98.890

## 2020-02-06 NOTE — Progress Notes (Signed)
COVID Vaccine Completed:Yes Date COVID Vaccine completed:10/13/19 COVID vaccine manufacturer: Pfizer   PCP - Dr. Kathrene Bongo Cardiologist - no  Chest x-ray - no EKG - 01/25/20 Stress Test - no ECHO - no Cardiac Cath - no  Sleep Study - no CPAP -   Fasting Blood Sugar - NA Checks Blood Sugar _____ times a day  Blood Thinner Instructions:NA Aspirin Instructions: Last Dose:  Anesthesia review:   Patient denies shortness of breath, fever, cough and chest pain at PAT appointment yes  Patient verbalized understanding of instructions that were given to them at the PAT appointment. Patient was also instructed that they will need to review over the PAT instructions again at home before surgery. Yes  Pt refused all blood products .

## 2020-02-07 ENCOUNTER — Ambulatory Visit (HOSPITAL_COMMUNITY)
Admission: RE | Admit: 2020-02-07 | Discharge: 2020-02-07 | Disposition: A | Discharge status: Home / Self Care | Payer: No Typology Code available for payment source | Source: Ambulatory Visit | Attending: Orthopedic Surgery | Admitting: Orthopedic Surgery

## 2020-02-07 ENCOUNTER — Encounter (HOSPITAL_COMMUNITY)
Admission: RE | Admit: 2020-02-07 | Discharge: 2020-02-07 | Disposition: A | Discharge status: Home / Self Care | Payer: No Typology Code available for payment source | Source: Ambulatory Visit | Attending: Orthopedic Surgery | Admitting: Orthopedic Surgery

## 2020-02-07 DIAGNOSIS — Z01811 Encounter for preprocedural respiratory examination: Secondary | ICD-10-CM | POA: Insufficient documentation

## 2020-02-07 LAB — CBC WITH DIFFERENTIAL/PLATELET
Abs Immature Granulocytes: 0.03 10*3/uL (ref 0.00–0.07)
Basophils Absolute: 0.1 10*3/uL (ref 0.0–0.1)
Basophils Relative: 1 %
Eosinophils Absolute: 0.3 10*3/uL (ref 0.0–0.5)
Eosinophils Relative: 3 %
HCT: 40.5 % (ref 36.0–46.0)
Hemoglobin: 13.1 g/dL (ref 12.0–15.0)
Immature Granulocytes: 0 %
Lymphocytes Relative: 22 %
Lymphs Abs: 2 10*3/uL (ref 0.7–4.0)
MCH: 29.7 pg (ref 26.0–34.0)
MCHC: 32.3 g/dL (ref 30.0–36.0)
MCV: 91.8 fL (ref 80.0–100.0)
Monocytes Absolute: 0.5 10*3/uL (ref 0.1–1.0)
Monocytes Relative: 6 %
Neutro Abs: 5.9 10*3/uL (ref 1.7–7.7)
Neutrophils Relative %: 68 %
Platelets: 278 10*3/uL (ref 150–400)
RBC: 4.41 MIL/uL (ref 3.87–5.11)
RDW: 13.3 % (ref 11.5–15.5)
WBC: 8.8 10*3/uL (ref 4.0–10.5)
nRBC: 0 % (ref 0.0–0.2)

## 2020-02-07 LAB — URINALYSIS, ROUTINE W REFLEX MICROSCOPIC
Bilirubin Urine: NEGATIVE
Glucose, UA: NEGATIVE mg/dL
Hgb urine dipstick: NEGATIVE
Ketones, ur: NEGATIVE mg/dL
Leukocytes,Ua: NEGATIVE
Nitrite: NEGATIVE
Protein, ur: NEGATIVE mg/dL
Specific Gravity, Urine: 1.015 (ref 1.005–1.030)
pH: 5 (ref 5.0–8.0)

## 2020-02-07 LAB — COMPREHENSIVE METABOLIC PANEL
ALT: 23 U/L (ref 0–44)
AST: 20 U/L (ref 15–41)
Albumin: 3.9 g/dL (ref 3.5–5.0)
Alkaline Phosphatase: 74 U/L (ref 38–126)
Anion gap: 7 (ref 5–15)
BUN: 16 mg/dL (ref 6–20)
CO2: 29 mmol/L (ref 22–32)
Calcium: 9.2 mg/dL (ref 8.9–10.3)
Chloride: 104 mmol/L (ref 98–111)
Creatinine, Ser: 0.88 mg/dL (ref 0.44–1.00)
GFR calc Af Amer: >60 mL/min (ref >60)
GFR calc non Af Amer: >60 mL/min (ref >60)
Glucose, Bld: 95 mg/dL (ref 70–99)
Potassium: 4 mmol/L (ref 3.5–5.1)
Sodium: 140 mmol/L (ref 135–145)
Total Bilirubin: 0.3 mg/dL (ref 0.3–1.2)
Total Protein: 7.4 g/dL (ref 6.5–8.1)

## 2020-02-07 LAB — SURGICAL PCR SCREEN
MRSA, PCR: NEGATIVE
Staphylococcus aureus: NEGATIVE

## 2020-02-07 LAB — APTT: aPTT: 29 seconds (ref 24–36)

## 2020-02-07 LAB — PROTIME-INR
INR: 1 (ref 0.8–1.2)
Prothrombin Time: 12.5 seconds (ref 11.4–15.2)

## 2020-02-12 ENCOUNTER — Other Ambulatory Visit (HOSPITAL_COMMUNITY)
Admission: RE | Admit: 2020-02-12 | Discharge: 2020-02-12 | Disposition: A | Discharge status: Home / Self Care | Payer: No Typology Code available for payment source | Source: Ambulatory Visit | Attending: Orthopedic Surgery | Admitting: Orthopedic Surgery

## 2020-02-12 DIAGNOSIS — Z01812 Encounter for preprocedural laboratory examination: Secondary | ICD-10-CM | POA: Diagnosis not present

## 2020-02-12 DIAGNOSIS — Z20822 Contact with and (suspected) exposure to covid-19: Secondary | ICD-10-CM | POA: Insufficient documentation

## 2020-02-12 LAB — SARS CORONAVIRUS 2 (TAT 6-24 HRS): SARS Coronavirus 2: NEGATIVE

## 2020-02-14 MED ORDER — BUPIVACAINE LIPOSOME 1.3 % IJ SUSP
20.0000 mL | Freq: Once | INTRAMUSCULAR | Status: DC
  Filled 2020-02-14: qty 20

## 2020-02-15 ENCOUNTER — Encounter (HOSPITAL_COMMUNITY): Payer: Self-pay | Admitting: Orthopedic Surgery

## 2020-02-15 ENCOUNTER — Encounter (HOSPITAL_COMMUNITY)
Admission: RE | Disposition: A | Discharge status: Home Health | Payer: Self-pay | Source: Ambulatory Visit | Attending: Orthopedic Surgery

## 2020-02-15 ENCOUNTER — Ambulatory Visit (HOSPITAL_COMMUNITY): Payer: No Typology Code available for payment source | Admitting: Anesthesiology

## 2020-02-15 ENCOUNTER — Observation Stay (HOSPITAL_COMMUNITY)
Admission: RE | Admit: 2020-02-15 | Discharge: 2020-02-16 | Disposition: A | Discharge status: Home Health | Payer: No Typology Code available for payment source | Source: Ambulatory Visit | Attending: Orthopedic Surgery | Admitting: Orthopedic Surgery

## 2020-02-15 DIAGNOSIS — K589 Irritable bowel syndrome without diarrhea: Secondary | ICD-10-CM | POA: Diagnosis not present

## 2020-02-15 DIAGNOSIS — M069 Rheumatoid arthritis, unspecified: Secondary | ICD-10-CM | POA: Insufficient documentation

## 2020-02-15 DIAGNOSIS — Z791 Long term (current) use of non-steroidal anti-inflammatories (NSAID): Secondary | ICD-10-CM | POA: Diagnosis not present

## 2020-02-15 DIAGNOSIS — M1711 Unilateral primary osteoarthritis, right knee: Principal | ICD-10-CM | POA: Insufficient documentation

## 2020-02-15 DIAGNOSIS — Z79891 Long term (current) use of opiate analgesic: Secondary | ICD-10-CM | POA: Insufficient documentation

## 2020-02-15 DIAGNOSIS — M25761 Osteophyte, right knee: Secondary | ICD-10-CM | POA: Insufficient documentation

## 2020-02-15 HISTORY — PX: TOTAL KNEE ARTHROPLASTY: SHX125

## 2020-02-15 LAB — GLUCOSE, CAPILLARY: Glucose-Capillary: 123 mg/dL — ABNORMAL HIGH (ref 70–99)

## 2020-02-15 SURGERY — ARTHROPLASTY, KNEE, TOTAL
Anesthesia: Spinal | Site: Knee | Laterality: Right

## 2020-02-15 MED ORDER — CHLORHEXIDINE GLUCONATE 0.12 % MT SOLN
15.0000 mL | Freq: Once | OROMUCOSAL | Status: AC
  Administered 2020-02-15: 15 mL via OROMUCOSAL

## 2020-02-15 MED ORDER — ONDANSETRON HCL 4 MG/2ML IJ SOLN
INTRAMUSCULAR | Status: DC | PRN
  Administered 2020-02-15: 4 mg via INTRAVENOUS

## 2020-02-15 MED ORDER — PROPOFOL 10 MG/ML IV BOLUS
INTRAVENOUS | Status: AC
  Filled 2020-02-15: qty 20

## 2020-02-15 MED ORDER — ONDANSETRON HCL 4 MG/2ML IJ SOLN
INTRAMUSCULAR | Status: AC
  Filled 2020-02-15: qty 2

## 2020-02-15 MED ORDER — ASPIRIN EC 325 MG PO TBEC
325.0000 mg | DELAYED_RELEASE_TABLET | Freq: Two times a day (BID) | ORAL | Status: DC
  Administered 2020-02-15 – 2020-02-16 (×2): 325 mg via ORAL
  Filled 2020-02-15 (×2): qty 1

## 2020-02-15 MED ORDER — ORAL CARE MOUTH RINSE: 15.0000 mL | Freq: Once | OROMUCOSAL | Status: AC

## 2020-02-15 MED ORDER — PHENYLEPHRINE 40 MCG/ML (10ML) SYRINGE FOR IV PUSH (FOR BLOOD PRESSURE SUPPORT)
PREFILLED_SYRINGE | INTRAVENOUS | Status: DC | PRN
  Administered 2020-02-15: 120 ug via INTRAVENOUS

## 2020-02-15 MED ORDER — CEFAZOLIN SODIUM-DEXTROSE 2-4 GM/100ML-% IV SOLN
2.0000 g | INTRAVENOUS | Status: AC
  Administered 2020-02-15: 2 g via INTRAVENOUS
  Filled 2020-02-15: qty 100

## 2020-02-15 MED ORDER — BUPIVACAINE-EPINEPHRINE 0.25% -1:200000 IJ SOLN
INTRAMUSCULAR | Status: DC | PRN
  Administered 2020-02-15: 30 mL

## 2020-02-15 MED ORDER — DOCUSATE SODIUM 100 MG PO CAPS
100.0000 mg | ORAL_CAPSULE | Freq: Two times a day (BID) | ORAL | Status: DC
  Administered 2020-02-15 – 2020-02-16 (×2): 100 mg via ORAL
  Filled 2020-02-15 (×2): qty 1

## 2020-02-15 MED ORDER — HYDROMORPHONE HCL 1 MG/ML IJ SOLN: 0.2500 mg | INTRAMUSCULAR | Status: DC | PRN

## 2020-02-15 MED ORDER — TRANEXAMIC ACID-NACL 1000-0.7 MG/100ML-% IV SOLN
1000.0000 mg | Freq: Once | INTRAVENOUS | Status: AC
  Administered 2020-02-15: 1000 mg via INTRAVENOUS
  Filled 2020-02-15: qty 100

## 2020-02-15 MED ORDER — DEXAMETHASONE SODIUM PHOSPHATE 4 MG/ML IJ SOLN
INTRAMUSCULAR | Status: DC | PRN
  Administered 2020-02-15: 8 mg via PERINEURAL

## 2020-02-15 MED ORDER — DEXAMETHASONE SODIUM PHOSPHATE 10 MG/ML IJ SOLN
INTRAMUSCULAR | Status: AC
  Filled 2020-02-15: qty 1

## 2020-02-15 MED ORDER — TIZANIDINE HCL 2 MG PO TABS
2.0000 mg | ORAL_TABLET | Freq: Three times a day (TID) | ORAL | 0 refills | Status: DC | PRN
Start: 2020-02-15 — End: 2021-01-22

## 2020-02-15 MED ORDER — LACTATED RINGERS IV SOLN: INTRAVENOUS | Status: DC

## 2020-02-15 MED ORDER — MIDAZOLAM HCL 2 MG/2ML IJ SOLN
1.0000 mg | Freq: Once | INTRAMUSCULAR | Status: AC
  Administered 2020-02-15: 2 mg via INTRAVENOUS
  Filled 2020-02-15: qty 2

## 2020-02-15 MED ORDER — ACETAMINOPHEN 325 MG PO TABS
325.0000 mg | ORAL_TABLET | Freq: Four times a day (QID) | ORAL | Status: DC | PRN
  Administered 2020-02-16: 650 mg via ORAL
  Filled 2020-02-15: qty 2

## 2020-02-15 MED ORDER — SODIUM CHLORIDE (PF) 0.9 % IJ SOLN
INTRAMUSCULAR | Status: AC
  Filled 2020-02-15: qty 50

## 2020-02-15 MED ORDER — OXYCODONE-ACETAMINOPHEN 5-325 MG PO TABS: 1.0000 | ORAL_TABLET | ORAL | 0 refills | Status: DC | PRN

## 2020-02-15 MED ORDER — 0.9 % SODIUM CHLORIDE (POUR BTL) OPTIME
TOPICAL | Status: DC | PRN
  Administered 2020-02-15: 1000 mL

## 2020-02-15 MED ORDER — MEPERIDINE HCL 50 MG/ML IJ SOLN: 6.2500 mg | INTRAMUSCULAR | Status: DC | PRN

## 2020-02-15 MED ORDER — SODIUM CHLORIDE 0.9 % IV SOLN: INTRAVENOUS | Status: DC

## 2020-02-15 MED ORDER — ACETAMINOPHEN 500 MG PO TABS
1000.0000 mg | ORAL_TABLET | ORAL | Status: AC
  Administered 2020-02-15: 1000 mg via ORAL
  Filled 2020-02-15: qty 2

## 2020-02-15 MED ORDER — PROPOFOL 500 MG/50ML IV EMUL
INTRAVENOUS | Status: DC | PRN
  Administered 2020-02-15: 115 ug/kg/min via INTRAVENOUS

## 2020-02-15 MED ORDER — ASPIRIN EC 325 MG PO TBEC: 325.0000 mg | DELAYED_RELEASE_TABLET | Freq: Two times a day (BID) | ORAL | 0 refills | Status: AC

## 2020-02-15 MED ORDER — SODIUM CHLORIDE 0.9 % IR SOLN
Status: DC | PRN
  Administered 2020-02-15: 1000 mL

## 2020-02-15 MED ORDER — CELECOXIB 200 MG PO CAPS
400.0000 mg | ORAL_CAPSULE | ORAL | Status: AC
  Administered 2020-02-15: 400 mg via ORAL
  Filled 2020-02-15: qty 2

## 2020-02-15 MED ORDER — ZOLPIDEM TARTRATE 5 MG PO TABS: 5.0000 mg | ORAL_TABLET | Freq: Every evening | ORAL | Status: DC | PRN

## 2020-02-15 MED ORDER — PHENYLEPHRINE HCL-NACL 10-0.9 MG/250ML-% IV SOLN
INTRAVENOUS | Status: DC | PRN
  Administered 2020-02-15: 40 ug/min via INTRAVENOUS

## 2020-02-15 MED ORDER — BUPIVACAINE LIPOSOME 1.3 % IJ SUSP
INTRAMUSCULAR | Status: DC | PRN
  Administered 2020-02-15: 20 mL

## 2020-02-15 MED ORDER — DEXAMETHASONE SODIUM PHOSPHATE 10 MG/ML IJ SOLN
10.0000 mg | Freq: Two times a day (BID) | INTRAMUSCULAR | Status: AC
  Administered 2020-02-15 – 2020-02-16 (×2): 10 mg via INTRAVENOUS
  Filled 2020-02-15 (×2): qty 1

## 2020-02-15 MED ORDER — PROPOFOL 1000 MG/100ML IV EMUL
INTRAVENOUS | Status: AC
  Filled 2020-02-15: qty 100

## 2020-02-15 MED ORDER — ONDANSETRON HCL 4 MG PO TABS
4.0000 mg | ORAL_TABLET | Freq: Four times a day (QID) | ORAL | Status: DC | PRN
  Administered 2020-02-16: 4 mg via ORAL
  Filled 2020-02-15: qty 1

## 2020-02-15 MED ORDER — CEFAZOLIN SODIUM-DEXTROSE 2-4 GM/100ML-% IV SOLN
2.0000 g | Freq: Four times a day (QID) | INTRAVENOUS | Status: AC
  Administered 2020-02-15 (×2): 2 g via INTRAVENOUS
  Filled 2020-02-15 (×2): qty 100

## 2020-02-15 MED ORDER — BUPIVACAINE-EPINEPHRINE (PF) 0.5% -1:200000 IJ SOLN
INTRAMUSCULAR | Status: DC | PRN
  Administered 2020-02-15: 30 mL via PERINEURAL

## 2020-02-15 MED ORDER — FENTANYL CITRATE (PF) 100 MCG/2ML IJ SOLN
50.0000 ug | Freq: Once | INTRAMUSCULAR | Status: AC
  Administered 2020-02-15: 100 ug via INTRAVENOUS
  Filled 2020-02-15: qty 2

## 2020-02-15 MED ORDER — BISACODYL 5 MG PO TBEC
5.0000 mg | DELAYED_RELEASE_TABLET | Freq: Every day | ORAL | Status: DC | PRN
  Administered 2020-02-16: 5 mg via ORAL
  Filled 2020-02-15: qty 1

## 2020-02-15 MED ORDER — GABAPENTIN 300 MG PO CAPS
300.0000 mg | ORAL_CAPSULE | ORAL | Status: AC
  Administered 2020-02-15: 300 mg via ORAL
  Filled 2020-02-15: qty 1

## 2020-02-15 MED ORDER — PHENYLEPHRINE HCL (PRESSORS) 10 MG/ML IV SOLN
INTRAVENOUS | Status: AC
  Filled 2020-02-15: qty 1

## 2020-02-15 MED ORDER — ONDANSETRON HCL 4 MG/2ML IJ SOLN: 4.0000 mg | Freq: Four times a day (QID) | INTRAMUSCULAR | Status: DC | PRN

## 2020-02-15 MED ORDER — CELECOXIB 200 MG PO CAPS
200.0000 mg | ORAL_CAPSULE | Freq: Two times a day (BID) | ORAL | Status: DC
  Administered 2020-02-15 – 2020-02-16 (×2): 200 mg via ORAL
  Filled 2020-02-15 (×2): qty 1

## 2020-02-15 MED ORDER — POLYETHYLENE GLYCOL 3350 17 G PO PACK: 17.0000 g | PACK | Freq: Every day | ORAL | Status: DC | PRN

## 2020-02-15 MED ORDER — GABAPENTIN 300 MG PO CAPS
300.0000 mg | ORAL_CAPSULE | Freq: Two times a day (BID) | ORAL | Status: DC
  Administered 2020-02-15 – 2020-02-16 (×2): 300 mg via ORAL
  Filled 2020-02-15 (×2): qty 1

## 2020-02-15 MED ORDER — PROPOFOL 500 MG/50ML IV EMUL
INTRAVENOUS | Status: AC
  Filled 2020-02-15: qty 50

## 2020-02-15 MED ORDER — PROPOFOL 10 MG/ML IV BOLUS
INTRAVENOUS | Status: DC | PRN
  Administered 2020-02-15: 10 mg via INTRAVENOUS
  Administered 2020-02-15: 40 mg via INTRAVENOUS

## 2020-02-15 MED ORDER — DOCUSATE SODIUM 100 MG PO CAPS
100.0000 mg | ORAL_CAPSULE | Freq: Two times a day (BID) | ORAL | 0 refills | Status: AC
Start: 2020-02-15 — End: ?

## 2020-02-15 MED ORDER — CLONIDINE HCL (ANALGESIA) 100 MCG/ML EP SOLN
EPIDURAL | Status: DC | PRN
  Administered 2020-02-15: 100 ug

## 2020-02-15 MED ORDER — BUPIVACAINE-EPINEPHRINE 0.25% -1:200000 IJ SOLN
INTRAMUSCULAR | Status: AC
  Filled 2020-02-15: qty 1

## 2020-02-15 MED ORDER — POVIDONE-IODINE 10 % EX SWAB
2.0000 "application " | Freq: Once | CUTANEOUS | Status: AC
  Administered 2020-02-15: 2 via TOPICAL

## 2020-02-15 MED ORDER — PROMETHAZINE HCL 25 MG/ML IJ SOLN: 6.2500 mg | INTRAMUSCULAR | Status: DC | PRN

## 2020-02-15 MED ORDER — TRANEXAMIC ACID-NACL 1000-0.7 MG/100ML-% IV SOLN
1000.0000 mg | INTRAVENOUS | Status: AC
  Administered 2020-02-15: 1000 mg via INTRAVENOUS
  Filled 2020-02-15: qty 100

## 2020-02-15 MED ORDER — OXYCODONE HCL 5 MG PO TABS
5.0000 mg | ORAL_TABLET | ORAL | Status: DC | PRN
  Administered 2020-02-15 – 2020-02-16 (×3): 10 mg via ORAL
  Filled 2020-02-15 (×3): qty 2

## 2020-02-15 MED ORDER — METHOCARBAMOL 500 MG IVPB - SIMPLE MED
500.0000 mg | Freq: Four times a day (QID) | INTRAVENOUS | Status: DC | PRN
  Filled 2020-02-15: qty 50

## 2020-02-15 MED ORDER — MAGNESIUM CITRATE PO SOLN: 1.0000 | Freq: Once | ORAL | Status: DC | PRN

## 2020-02-15 MED ORDER — HYDROMORPHONE HCL 1 MG/ML IJ SOLN: 0.5000 mg | INTRAMUSCULAR | Status: DC | PRN

## 2020-02-15 MED ORDER — ALUM & MAG HYDROXIDE-SIMETH 200-200-20 MG/5ML PO SUSP: 30.0000 mL | ORAL | Status: DC | PRN

## 2020-02-15 MED ORDER — BUPIVACAINE IN DEXTROSE 0.75-8.25 % IT SOLN
INTRATHECAL | Status: DC | PRN
  Administered 2020-02-15: 1.6 mL via INTRATHECAL

## 2020-02-15 MED ORDER — DEXAMETHASONE SODIUM PHOSPHATE 10 MG/ML IJ SOLN
INTRAMUSCULAR | Status: DC | PRN
  Administered 2020-02-15: 8 mg via INTRAVENOUS

## 2020-02-15 MED ORDER — SODIUM CHLORIDE 0.9% FLUSH
INTRAVENOUS | Status: DC | PRN
  Administered 2020-02-15: 50 mL

## 2020-02-15 MED ORDER — DIPHENHYDRAMINE HCL 12.5 MG/5ML PO ELIX: 12.5000 mg | ORAL_SOLUTION | ORAL | Status: DC | PRN

## 2020-02-15 MED ORDER — METHOCARBAMOL 500 MG PO TABS
500.0000 mg | ORAL_TABLET | Freq: Four times a day (QID) | ORAL | Status: DC | PRN
  Administered 2020-02-15 – 2020-02-16 (×2): 500 mg via ORAL
  Filled 2020-02-15 (×2): qty 1

## 2020-02-15 SURGICAL SUPPLY — 51 items
ATTUNE PSFEM RTSZ3 NARCEM KNEE (Femur) ×2 IMPLANT
ATTUNE PSRP INSR SZ3 6 KNEE (Insert) ×2 IMPLANT
BAG ZIPLOCK 12X15 (MISCELLANEOUS) ×2 IMPLANT
BASE TIBIAL ROT PLAT SZ 3 KNEE (Knees) ×1 IMPLANT
BENZOIN TINCTURE PRP APPL 2/3 (GAUZE/BANDAGES/DRESSINGS) ×2 IMPLANT
BLADE SAGITTAL 25.0X1.19X90 (BLADE) ×2 IMPLANT
BLADE SAW SGTL 13.0X1.19X90.0M (BLADE) ×2 IMPLANT
BLADE SURG SZ10 CARB STEEL (BLADE) ×4 IMPLANT
BNDG ELASTIC 6X5.8 VLCR STR LF (GAUZE/BANDAGES/DRESSINGS) ×2 IMPLANT
BOOTIES KNEE HIGH SLOAN (MISCELLANEOUS) ×2 IMPLANT
BOWL SMART MIX CTS (DISPOSABLE) ×2 IMPLANT
CEMENT HV SMART SET (Cement) ×4 IMPLANT
COVER SURGICAL LIGHT HANDLE (MISCELLANEOUS) ×2 IMPLANT
COVER WAND RF STERILE (DRAPES) IMPLANT
CUFF TOURN SGL QUICK 34 (TOURNIQUET CUFF) ×2
CUFF TRNQT CYL 34X4.125X (TOURNIQUET CUFF) ×1 IMPLANT
DECANTER SPIKE VIAL GLASS SM (MISCELLANEOUS) ×4 IMPLANT
DRAPE U-SHAPE 47X51 STRL (DRAPES) ×2 IMPLANT
DRSG AQUACEL AG ADV 3.5X10 (GAUZE/BANDAGES/DRESSINGS) ×2 IMPLANT
DURAPREP 26ML APPLICATOR (WOUND CARE) ×2 IMPLANT
ELECT REM PT RETURN 15FT ADLT (MISCELLANEOUS) ×2 IMPLANT
GLOVE BIOGEL PI IND STRL 8 (GLOVE) ×2 IMPLANT
GLOVE BIOGEL PI INDICATOR 8 (GLOVE) ×2
GLOVE ECLIPSE 7.5 STRL STRAW (GLOVE) ×4 IMPLANT
GOWN STRL REUS W/TWL XL LVL3 (GOWN DISPOSABLE) ×4 IMPLANT
HANDPIECE INTERPULSE COAX TIP (DISPOSABLE) ×2
HOLDER FOLEY CATH W/STRAP (MISCELLANEOUS) IMPLANT
HOOD PEEL AWAY FLYTE STAYCOOL (MISCELLANEOUS) ×6 IMPLANT
KIT TURNOVER KIT A (KITS) IMPLANT
MANIFOLD NEPTUNE II (INSTRUMENTS) ×2 IMPLANT
NEEDLE HYPO 22GX1.5 SAFETY (NEEDLE) ×2 IMPLANT
NS IRRIG 1000ML POUR BTL (IV SOLUTION) ×2 IMPLANT
PACK ICE MAXI GEL EZY WRAP (MISCELLANEOUS) ×2 IMPLANT
PACK TOTAL KNEE CUSTOM (KITS) ×2 IMPLANT
PADDING CAST COTTON 6X4 STRL (CAST SUPPLIES) ×2 IMPLANT
PATELLA MEDIAL ATTUN 35MM KNEE (Knees) ×2 IMPLANT
PENCIL SMOKE EVACUATOR (MISCELLANEOUS) IMPLANT
PIN DRILL FIX HALF THREAD (BIT) ×2 IMPLANT
PIN STEINMAN FIXATION KNEE (PIN) ×2 IMPLANT
PROTECTOR NERVE ULNAR (MISCELLANEOUS) ×2 IMPLANT
SET HNDPC FAN SPRY TIP SCT (DISPOSABLE) ×1 IMPLANT
STRIP CLOSURE SKIN 1/2X4 (GAUZE/BANDAGES/DRESSINGS) ×2 IMPLANT
SUT MNCRL AB 3-0 PS2 18 (SUTURE) ×2 IMPLANT
SUT VIC AB 0 CT1 36 (SUTURE) ×2 IMPLANT
SUT VIC AB 1 CT1 36 (SUTURE) ×4 IMPLANT
SYR CONTROL 10ML LL (SYRINGE) ×4 IMPLANT
TIBIAL BASE ROT PLAT SZ 3 KNEE (Knees) ×2 IMPLANT
TRAY FOLEY MTR SLVR 16FR STAT (SET/KITS/TRAYS/PACK) ×2 IMPLANT
WATER STERILE IRR 1000ML POUR (IV SOLUTION) ×4 IMPLANT
WRAP KNEE MAXI GEL POST OP (GAUZE/BANDAGES/DRESSINGS) ×2 IMPLANT
YANKAUER SUCT BULB TIP 10FT TU (MISCELLANEOUS) ×2 IMPLANT

## 2020-02-15 NOTE — Discharge Instructions (Signed)

## 2020-02-15 NOTE — Anesthesia Procedure Notes (Signed)
Anesthesia Regional Block: Adductor canal block   Pre-Anesthetic Checklist: ,, timeout performed, Correct Patient, Correct Site, Correct Laterality, Correct Procedure, Correct Position, site marked, Risks and benefits discussed,  Surgical consent,  Pre-op evaluation,  At surgeon's request and post-op pain management  Laterality: Right  Prep: chloraprep       Needles:  Injection technique: Single-shot  Needle Type: Stimiplex     Needle Length: 9cm  Needle Gauge: 21     Additional Needles:   Procedures:,,,, ultrasound used (permanent image in chart),,,,  Narrative:  Start time: 02/15/2020 9:40 AM End time: 02/15/2020 9:45 AM Injection made incrementally with aspirations every 5 mL.  Performed by: Personally  Anesthesiologist: Lewie Loron, MD  Additional Notes: BP cuff, EKG monitors applied. Sedation begun. Artery and nerve location verified with U/S and anesthetic injected incrementally, slowly, and after negative aspirations under direct u/s guidance. Good fascial /perineural spread. Tolerated well.

## 2020-02-15 NOTE — Transfer of Care (Addendum)
Immediate Anesthesia Transfer of Care Note  Patient: Asiya Cutbirth  Procedure(s) Performed: TOTAL KNEE ARTHROPLASTY (Right Knee)  Patient Location: PACU  Anesthesia Type:Spinal  Level of Consciousness: awake  Airway & Oxygen Therapy: Patient Spontanous Breathing and Patient connected to face mask oxygen  Post-op Assessment: Report given to RN and Post -op Vital signs reviewed and stable  Post vital signs: Reviewed and stable  Last Vitals:  Vitals Value Taken Time  BP 87/50 02/15/20 1331  Temp    Pulse 55 02/15/20 1332  Resp 11 02/15/20 1332  SpO2 100 % 02/15/20 1332  Vitals shown include unvalidated device data.  Last Pain:  Vitals:   02/15/20 0944  TempSrc:   PainSc: 0-No pain         Complications: No complications documented.

## 2020-02-15 NOTE — Anesthesia Postprocedure Evaluation (Signed)
Anesthesia Post Note  Patient: Danielle Pace  Procedure(s) Performed: TOTAL KNEE ARTHROPLASTY (Right Knee)     Patient location during evaluation: PACU Anesthesia Type: Spinal Level of consciousness: awake and alert Pain management: pain level controlled Vital Signs Assessment: post-procedure vital signs reviewed and stable Respiratory status: spontaneous breathing Cardiovascular status: stable Anesthetic complications: no   No complications documented.  Last Vitals:  Vitals:   02/15/20 1515 02/15/20 1529  BP: 111/66 119/71  Pulse: 62 (!) 58  Resp: 12 14  Temp: (!) 36.3 C (!) 36.4 C  SpO2: 100% 100%    Last Pain:  Vitals:   02/15/20 1500  TempSrc:   PainSc: 0-No pain                 Lewie Loron

## 2020-02-15 NOTE — Op Note (Addendum)
PATIENT ID:      Danielle Pace  MRN:     [id number] DOB/AGE:    55-May-1967 / 55 y.o.       OPERATIVE REPORT   DATE OF PROCEDURE:  02/15/2020      PREOPERATIVE DIAGNOSIS:   RIGHT KNEE DEGENERATIVE JOINT DISEASE      Estimated body mass index is 32.92 kg/m as calculated from the following:   Height as of 02/07/20: 4\' 11"  (1.499 m).   Weight as of this encounter: 73.9 kg.                                                       POSTOPERATIVE DIAGNOSIS:   Same                                                                  PROCEDURE:  Procedure(s): TOTAL KNEE ARTHROPLASTY Using DepuyAttune RP implants #3N Femur, #3Tibia, 6 mm Attune RP bearing, 35 Patella    SURGEON: Alta Corning  ASSISTANT:   Harmon Dun PA-C   (Present and scrubbed throughout the case, critical for assistance with exposure, retraction, instrumentation, and closure.)        ANESTHESIA: spinal, 20cc Exparel, 50cc 0.25% Marcaine EBL: min cc FLUID REPLACEMENT: unk cc crystaloid TOURNIQUET: DRAINS: None TRANEXAMIC ACID: 1gm IV, 2gm topical COMPLICATIONS:  None         INDICATIONS FOR PROCEDURE: The patient has  RIGHT KNEE DEGENERATIVE JOINT DISEASE, mild varus deformities, XR shows bone on bone arthritis, lateral subluxation of tibia. Patient has failed all conservative measures including anti-inflammatory medicines, narcotics, attempts at exercise and weight loss, cortisone injections and viscosupplementation.  Risks and benefits of surgery have been discussed, questions answered.   DESCRIPTION OF PROCEDURE: The patient identified by armband, received  IV antibiotics, in the holding area at [facility]. Patient taken to the operating room, appropriate anesthetic monitors were attached, and spinal anesthesia was  induced. IV Tranexamic acid was given.Tourniquet applied high to the operative thigh. Lateral post and foot positioner applied to the table, the lower extremity was then prepped and draped in usual sterile  fashion from the toes to the tourniquet. Time-out procedure was performed. Harmon Dun [facility], was present and scrubbed throughout the case, critical for assistance with, positioning, exposure, retraction, instrumentation, and closure.The skin and subcutaneous tissue along the incision was injected with 20 cc of a mixture of Exparel and Marcaine solution, using a 20-gauge by 1-1/2 inch needle. We began the operation, with the knee flexed 130 degrees, by making the anterior midline incision starting at handbreadth above the patella going over the patella 1 cm medial to and 4 cm distal to the tibial tubercle. Small bleeders in the skin and the subcutaneous tissue identified and cauterized. Transverse retinaculum was incised and reflected medially and a medial parapatellar arthrotomy was accomplished. the patella was everted and theprepatellar fat pad resected. The superficial medial collateral ligament was then elevated from anterior to posterior along the proximal flare of the tibia and anterior half of the menisci resected. The knee was hyperflexed exposing bone on bone arthritis. Peripheral  and notch osteophytes as well as the cruciate ligaments were then resected. We continued to work our way around posteriorly along the proximal tibia, and externally rotated the tibia subluxing it out from underneath the femur. A McHale PCL retractor was placed through the notch and a lateral Hohmann retractor placed, and we then entered the proximal tibia in line with the Depuy starter drill in line with the axis of the tibia followed by an intramedullary guide rod and 0-degree posterior slope cutting guide. The tibial cutting guide, 4 degree posterior sloped, was pinned into place allowing resection of 2 mm of bone medially and 10 mm of bone laterally. Satisfied with the tibial resection, we then entered the distal femur 2 mm anterior to the PCL origin with the intramedullary guide rod and applied the distal femoral cutting  guide set at 9 mm, with 5 degrees of valgus. This was pinned along the epicondylar axis. At this point, the distal femoral cut was accomplished without difficulty. We then sized for a #3 femoral component and pinned the guide in 3 degrees of external rotation. The chamfer cutting guide was pinned into place. The anterior, posterior, and chamfer cuts were accomplished without difficulty followed by the Attune RP box cutting guide and the box cut. We also removed posterior osteophytes from the posterior femoral condyles. The posterior capsule was injected with Exparel solution. The knee was brought into full extension. We checked our extension gap and fit a 6 mm bearing. Distracting in extension with a lamina spreader,  bleeders in the posterior capsule, Posterior medial and posterior lateral gutter were cauterized.  The transexamic acid-soaked sponge was then placed in the gap of the knee in extension. The knee was flexed 30. The posterior patella cut was accomplished with the 9.5 mm Attune cutting guide, sized for a 47mm dome, and the fixation pegs drilled.The knee was then once again hyperflexed exposing the proximal tibia. We sized for a # 3 tibial base plate, applied the smokestack and the conical reamer followed by the the Delta fin keel punch. We then hammered into place the Attune RP trial femoral component, drilled the lugs, inserted a  6 mm trial bearing, trial patellar button, and took the knee through range of motion from 0-130 degrees. Medial and lateral ligamentous stability was checked. No thumb pressure was required for patellar Tracking. The tourniquet was released@53min . All trial components were removed, mating surfaces irrigated with pulse lavage, and dried with suction and sponges. 10 cc of the Exparel solution was applied to the cancellus bone of the patella distal femur and proximal tibia.  After waiting 30 seconds, the bony surfaces were again, dried with sponges. A double batch of DePuy HV  cement was mixed and applied to all bony metallic mating surfaces except for the posterior condyles of the femur itself. In order, we hammered into place the tibial tray and removed excess cement, the femoral component and removed excess cement. The final Attune RP bearing was inserted, and the knee brought to full extension with compression. The patellar button was clamped into place, and excess cement removed. The knee was held at 30 flexion with compression, while the cement cured. The wound was irrigated out with normal saline solution pulse lavage. The rest of the Exparel was injected into the parapatellar arthrotomy, subcutaneous tissues, and periosteal tissues. The parapatellar arthrotomy was closed with running #1 Vicryl suture. The subcutaneous tissue with 3-0 undyed Vicryl suture, and the skin with running 3-0 SQ vicryl. An Aquacil and Ace  wrap were applied. The patient was taken to recovery room without difficulty.   Danielle Pace 02/15/2020, 12:31 PM  PATIENT ID:      Danielle Pace  MRN:     [id number] DOB/AGE:    1965/08/31 / 54 y.o.

## 2020-02-15 NOTE — H&P (Signed)
TOTAL KNEE ADMISSION H&P  Patient is being admitted for right total knee arthroplasty.  Subjective:  Chief Complaint:right knee pain.  HPI: Danielle Pace, 54 y.o. female, has a history of pain and functional disability in the right knee due to arthritis and has failed non-surgical conservative treatments for greater than 12 weeks to includeNSAID's and/or analgesics, corticosteriod injections, viscosupplementation injections and activity modification.  Onset of symptoms was gradual, starting 8 years ago with gradually worsening course since that time. The patient noted no past surgery on the right knee(s).  Patient currently rates pain in the right knee(s) at 8 out of 10 with activity. Patient has night pain, worsening of pain with activity and weight bearing, pain that interferes with activities of daily living, pain with passive range of motion, crepitus and joint swelling.  Patient has evidence of subchondral sclerosis and joint space narrowing by imaging studies. This patient has had Failure of all reasonable conservative care. There is no active infection.  Patient Active Problem List   Diagnosis Date Noted  . RA (rheumatoid arthritis) (HCC)   . Breast hypertrophy in female 11/03/2015  . Vitamin D deficiency 06/27/2014  . Prediabetes 06/27/2014  . Elevated LDL cholesterol level 06/27/2014  . Dyspepsia and other specified disorders of function of stomach 08/08/2013  . Special screening for malignant neoplasms, colon 08/08/2013   Past Medical History:  Diagnosis Date  . Arthritis   . Complication of anesthesia   . GERD (gastroesophageal reflux disease)   . Irritable bowel syndrome    occationally  . Meniscus tear    right  . PONV (postoperative nausea and vomiting)   . RA (rheumatoid arthritis) (HCC)     Past Surgical History:  Procedure Laterality Date  . FOOT SURGERY    . OVARY SURGERY    . REDUCTION MAMMAPLASTY  2017    Current Facility-Administered Medications   Medication Dose Route Frequency Provider Last Rate Last Admin  . bupivacaine liposome (EXPAREL) 1.3 % injection 266 mg  20 mL Other Once Jodi Geralds, MD       Allergies  Allergen Reactions  . Omnipaque [Iohexol] Hives and Itching  . Other Other (See Comments)    NO BLOOD PRODUCTS     Social History   Tobacco Use  . Smoking status: Never Smoker  . Smokeless tobacco: Never Used  Substance Use Topics  . Alcohol use: No    Family History  Problem Relation Age of Onset  . Breast cancer Mother   . Diabetes Mother   . Hypertension Mother   . Kidney disease Sister   . Colon polyps Sister        x 2 sisters     Review of Systems ROS: I have reviewed the patient's review of systems thoroughly and there are no positive responses as relates to the HPI. Objective:  Physical Exam  Vital signs in last 24 hours:   Well-developed well-nourished patient in no acute distress. Alert and oriented x3 HEENT:within normal limits Cardiac: Regular rate and rhythm Pulmonary: Lungs clear to auscultation Abdomen: Soft and nontender.  Normal active bowel sounds  Musculoskeletal: (Right knee: Painful range of motion.  Limited range of motion.  No instability.  Trace effusion.  Neurovascular intact distally. Labs: Recent Results (from the past 2160 hour(s))  TSH     Status: None   Collection Time: 01/25/20 11:44 AM  Result Value Ref Range   TSH 1.98 0.35 - 4.50 uIU/mL  Comprehensive metabolic panel     Status: None  Collection Time: 01/25/20 11:44 AM  Result Value Ref Range   Sodium 139 135 - 145 mEq/L   Potassium 3.7 3.5 - 5.1 mEq/L   Chloride 102 96 - 112 mEq/L   CO2 29 19 - 32 mEq/L   Glucose, Bld 81 70 - 99 mg/dL   BUN 16 6 - 23 mg/dL   Creatinine, Ser 1.32 0.40 - 1.20 mg/dL   Total Bilirubin 0.4 0.2 - 1.2 mg/dL   Alkaline Phosphatase 65 39 - 117 U/L   AST 17 0 - 37 U/L   ALT 21 0 - 35 U/L   Total Protein 7.5 6.0 - 8.3 g/dL   Albumin 4.3 3.5 - 5.2 g/dL   GFR 44.01 >02.72  mL/min   Calcium 9.7 8.4 - 10.5 mg/dL  CBC with Differential/Platelet     Status: None   Collection Time: 01/25/20 11:44 AM  Result Value Ref Range   WBC 8.0 4.0 - 10.5 K/uL   RBC 4.42 3.87 - 5.11 Mil/uL   Hemoglobin 13.4 12.0 - 15.0 g/dL   HCT 53.6 36 - 46 %   MCV 89.7 78.0 - 100.0 fl   MCHC 33.8 30.0 - 36.0 g/dL   RDW 64.4 03.4 - 74.2 %   Platelets 263.0 150 - 400 K/uL   Neutrophils Relative % 66.0 43 - 77 %   Lymphocytes Relative 23.1 12 - 46 %   Monocytes Relative 7.1 3 - 12 %   Eosinophils Relative 3.0 0 - 5 %   Basophils Relative 0.8 0 - 3 %   Neutro Abs 5.3 1.4 - 7.7 K/uL   Lymphs Abs 1.8 0.7 - 4.0 K/uL   Monocytes Absolute 0.6 0 - 1 K/uL   Eosinophils Absolute 0.2 0 - 0 K/uL   Basophils Absolute 0.1 0 - 0 K/uL  APTT     Status: None   Collection Time: 02/07/20  1:56 PM  Result Value Ref Range   aPTT 29 24 - 36 seconds    Comment: Performed at [facility], [address] [place], [place] [address]  CBC WITH DIFFERENTIAL     Status: None   Collection Time: 02/07/20  1:56 PM  Result Value Ref Range   WBC 8.8 4.0 - 10.5 K/uL   RBC 4.41 3.87 - 5.11 MIL/uL   Hemoglobin 13.1 12.0 - 15.0 g/dL   HCT 87.5 36 - 46 %   MCV 91.8 80.0 - 100.0 fL   MCH 29.7 26.0 - 34.0 pg   MCHC 32.3 30.0 - 36.0 g/dL   RDW 64.3 32.9 - 51.8 %   Platelets 278 150 - 400 K/uL   nRBC 0.0 0.0 - 0.2 %   Neutrophils Relative % 68 %   Neutro Abs 5.9 1.7 - 7.7 K/uL   Lymphocytes Relative 22 %   Lymphs Abs 2.0 0.7 - 4.0 K/uL   Monocytes Relative 6 %   Monocytes Absolute 0.5 0 - 1 K/uL   Eosinophils Relative 3 %   Eosinophils Absolute 0.3 0 - 0 K/uL   Basophils Relative 1 %   Basophils Absolute 0.1 0 - 0 K/uL   Immature Granulocytes 0 %   Abs Immature Granulocytes 0.03 0.00 - 0.07 K/uL    Comment: Performed at [facility], [address], [place], [place] [address]  Comprehensive metabolic panel     Status: None   Collection Time: 02/07/20  1:56 PM  Result  Value Ref Range   Sodium 140 135 - 145  mmol/L   Potassium 4.0 3.5 - 5.1 mmol/L   Chloride 104 98 - 111 mmol/L   CO2 29 22 - 32 mmol/L   Glucose, Bld 95 70 - 99 mg/dL    Comment: Glucose reference range applies only to samples taken after fasting for at least 8 hours.   BUN 16 6 - 20 mg/dL   Creatinine, Ser 0.88 0.44 - 1.00 mg/dL   Calcium 9.2 8.9 - 10.3 mg/dL   Total Protein 7.4 6.5 - 8.1 g/dL   Albumin 3.9 3.5 - 5.0 g/dL   AST 20 15 - 41 U/L   ALT 23 0 - 44 U/L   Alkaline Phosphatase 74 38 - 126 U/L   Total Bilirubin 0.3 0.3 - 1.2 mg/dL   GFR calc non Af Amer >60 >60 mL/min   GFR calc Af Amer >60 >60 mL/min   Anion gap 7 5 - 15    Comment: Performed at [facility], [place] [address] [place], [place] [address]  Protime-INR     Status: None   Collection Time: 02/07/20  1:56 PM  Result Value Ref Range   Prothrombin Time 12.5 11.4 - 15.2 seconds   INR 1.0 0.8 - 1.2    Comment: (NOTE) INR goal varies based on device and disease states. Performed at [facility], [place] [address] [place], [place] [address]   Urinalysis, Routine w reflex microscopic     Status: None   Collection Time: 02/07/20  1:56 PM  Result Value Ref Range   Color, Urine YELLOW YELLOW   APPearance CLEAR CLEAR   Specific Gravity, Urine 1.015 1.005 - 1.030   pH 5.0 5.0 - 8.0   Glucose, UA NEGATIVE NEGATIVE mg/dL   Hgb urine dipstick NEGATIVE NEGATIVE   Bilirubin Urine NEGATIVE NEGATIVE   Ketones, ur NEGATIVE NEGATIVE mg/dL   Protein, ur NEGATIVE NEGATIVE mg/dL   Nitrite NEGATIVE NEGATIVE   Leukocytes,Ua NEGATIVE NEGATIVE    Comment: Performed at [facility], [place] [address] [place], [place] [address]  Surgical pcr screen     Status: None   Collection Time: 02/07/20  1:56 PM   Specimen: Nasal Mucosa; Nasal Swab  Result Value Ref Range   MRSA, PCR NEGATIVE NEGATIVE   Staphylococcus aureus NEGATIVE NEGATIVE    Comment: (NOTE) The Xpert SA Assay (FDA  approved for NASAL specimens in patients 72 years of age and older), is one component of a comprehensive surveillance program. It is not intended to diagnose infection nor to guide or monitor treatment. Performed at [facility], [place] [address] [place], [place] [address]   SARS CORONAVIRUS 2 (TAT 6-24 HRS) Nasopharyngeal Nasopharyngeal Swab     Status: None   Collection Time: 02/12/20 10:22 AM   Specimen: Nasopharyngeal Swab  Result Value Ref Range   SARS Coronavirus 2 NEGATIVE NEGATIVE    Comment: (NOTE) SARS-CoV-2 target nucleic acids are NOT DETECTED.  The SARS-CoV-2 RNA is generally detectable in upper and lower respiratory specimens during the acute phase of infection. Negative results do not preclude SARS-CoV-2 infection, do not rule out co-infections with other pathogens, and should not be used as the sole basis for treatment or other patient management decisions. Negative results must be combined with clinical observations, patient history, and epidemiological information. The expected result is Negative.  Fact Sheet for Patients: SugarRoll.be  Fact Sheet for Healthcare Providers: https://www.woods-mathews.com/  This test is not yet approved or cleared by the [place] FDA and  has been authorized for detection and/or diagnosis of SARS-CoV-2 by FDA under an Emergency Use Authorization (EUA). This EUA will remain  in effect (meaning this test can be used) for the duration of the COVID-19 declaration under Se ction 564(b)(1) of the Act, 21 U.S.C. section 360bbb-3(b)(1), unless the authorization is terminated or revoked sooner.  Performed at [facility], [address] [place], [place] [address]     Estimated body mass index is 32.92 kg/m as calculated from the following:   Height as of 02/07/20: 4\' 11"  (1.499 m).   Weight as of 02/07/20: 73.9 kg.   Imaging Review Plain radiographs demonstrate  severe degenerative joint disease of the right knee(s). The overall alignment ismild varus. The bone quality appears to be fair for age and reported activity level.      Assessment/Plan:  End stage arthritis, right knee   The patient history, physical examination, clinical judgment of the provider and imaging studies are consistent with end stage degenerative joint disease of the right knee(s) and total knee arthroplasty is deemed medically necessary. The treatment options including medical management, injection therapy arthroscopy and arthroplasty were discussed at length. The risks and benefits of total knee arthroplasty were presented and reviewed. The risks due to aseptic loosening, infection, stiffness, patella tracking problems, thromboembolic complications and other imponderables were discussed. The patient acknowledged the explanation, agreed to proceed with the plan and consent was signed. Patient is being admitted for inpatient treatment for surgery, pain control, PT, OT, prophylactic antibiotics, VTE prophylaxis, progressive ambulation and ADL's and discharge planning. The patient is planning to be discharged home with home health services     Patient's anticipated LOS is less than 2 midnights, meeting these requirements: - Younger than 30 - Lives within 1 hour of care - Has a competent adult at home to recover with post-op recover - NO history of  - Chronic pain requiring opiods  - Diabetes  - Coronary Artery Disease  - Heart failure  - Heart attack  - Stroke  - DVT/VTE  - Cardiac arrhythmia  - Respiratory Failure/COPD  - Renal failure  - Anemia  - Advanced Liver disease

## 2020-02-15 NOTE — Anesthesia Preprocedure Evaluation (Signed)
Anesthesia Evaluation  Patient identified by MRN, date of birth, ID band Patient awake    Reviewed: Allergy & Precautions, NPO status , Patient's Chart, lab work & pertinent test results  History of Anesthesia Complications (+) PONV and history of anesthetic complications  Airway Mallampati: II  TM Distance: >3 FB Neck ROM: Full    Dental no notable dental hx. (+) Dental Advisory Given, Teeth Intact   Pulmonary neg pulmonary ROS,    Pulmonary exam normal breath sounds clear to auscultation       Cardiovascular negative cardio ROS Normal cardiovascular exam Rhythm:Regular Rate:Normal     Neuro/Psych negative neurological ROS  negative psych ROS   GI/Hepatic Neg liver ROS, GERD  ,  Endo/Other  negative endocrine ROS  Renal/GU negative Renal ROS     Musculoskeletal  (+) Arthritis ,   Abdominal (+) + obese,   Peds  Hematology  (+) [facility]  Anesthesia Other Findings   Reproductive/Obstetrics negative OB ROS                            Anesthesia Physical Anesthesia Plan  ASA: II  Anesthesia Plan: Spinal   Post-op Pain Management:  Regional for Post-op pain   Induction: Intravenous  PONV Risk Score and Plan: 4 or greater and Propofol infusion, TIVA, Midazolam, Ondansetron and Treatment may vary due to age or medical condition  Airway Management Planned: Natural Airway  Additional Equipment: None  Intra-op Plan:   Post-operative Plan:   Informed Consent: I have reviewed the patients History and Physical, chart, labs and discussed the procedure including the risks, benefits and alternatives for the proposed anesthesia with the patient or authorized representative who has indicated his/her understanding and acceptance.     Dental advisory given  Plan Discussed with: CRNA  Anesthesia Plan Comments:        Anesthesia Quick Evaluation

## 2020-02-15 NOTE — Anesthesia Procedure Notes (Signed)
Procedure Name: MAC Date/Time: 02/15/2020 10:45 AM Performed by: Niel Hummer, CRNA Pre-anesthesia Checklist: Patient identified, Emergency Drugs available, Patient being monitored and Suction available Oxygen Delivery Method: Simple face mask

## 2020-02-15 NOTE — Progress Notes (Signed)
Assisted Dr. Germeroth with right, ultrasound guided, adductor canal block. Side rails up, monitors on throughout procedure. See vital signs in flow sheet. Tolerated Procedure well. 

## 2020-02-15 NOTE — Evaluation (Signed)
Physical Therapy Evaluation Patient Details Name: Danielle Pace MRN: [id number] DOB: 06/10/1966 Today's Date: 02/15/2020   History of Present Illness  Pt s/p R TKR and with hx of RA  Clinical Impression  Pt s/p R TKR and presents with decreased R LE strength/ROM and post op pain limiting functional mobility.  PT should progress to dc home with family assist and HHPT follow up.    Follow Up Recommendations Home health PT;Follow surgeon's recommendation for DC plan and follow-up therapies    Equipment Recommendations  Rolling walker with 5" wheels (Youth level RW)    Recommendations for Other Services       Precautions / Restrictions Precautions Precautions: Knee;Fall Precaution Booklet Issued: No Restrictions Weight Bearing Restrictions: No Other Position/Activity Restrictions: WBAT      Mobility  Bed Mobility Overal bed mobility: Needs Assistance Bed Mobility: Supine to Sit     Supine to sit: Min assist     General bed mobility comments: cues for sequence and use of L LE to self assist  Transfers Overall transfer level: Needs assistance Equipment used: Rolling walker (2 wheeled) Transfers: Sit to/from Stand Sit to Stand: Min assist;Mod assist         General transfer comment: cues for LE management and use of UEs to self assist;  Physical assist to bring wt up and fwd and to balance in standing  Ambulation/Gait Ambulation/Gait assistance: Min assist;Mod assist Gait Distance (Feet): 18 Feet Assistive device: Rolling walker (2 wheeled) Gait Pattern/deviations: Step-to pattern;Decreased step length - right;Decreased step length - left;Shuffle;Trunk flexed Gait velocity: decr   General Gait Details: cues for sequence, posture and position from [employer]            Wheelchair Mobility    Modified Rankin (Stroke Patients Only)       Balance Overall balance assessment: Needs assistance Sitting-balance support: No upper extremity supported;Feet  supported Sitting balance-Leahy Scale: Good     Standing balance support: Bilateral upper extremity supported Standing balance-Leahy Scale: Poor                               Pertinent Vitals/Pain Pain Assessment: 0-10 Pain Score: 4  Pain Location: R knee Pain Descriptors / Indicators: Aching;Sore Pain Intervention(s): Limited activity within patient's tolerance;Monitored during session;Premedicated before session;Ice applied    Home Living Family/patient expects to be discharged to:: Private residence Living Arrangements: Alone Available Help at Discharge: Family;Available 24 hours/day Type of Home: Apartment Home Access: Stairs to enter Entrance Stairs-Rails: Right;Left;Can reach both Entrance Stairs-Number of Steps: 3+8 Home Layout: One level Home Equipment: Crutches      Prior Function Level of Independence: Independent               Hand Dominance        Extremity/Trunk Assessment   Upper Extremity Assessment Upper Extremity Assessment: Overall WFL for tasks assessed    Lower Extremity Assessment Lower Extremity Assessment: RLE deficits/detail    Cervical / Trunk Assessment Cervical / Trunk Assessment: Normal  Communication   Communication: No difficulties  Cognition Arousal/Alertness: Awake/alert Behavior During Therapy: WFL for tasks assessed/performed Overall Cognitive Status: Within Functional Limits for tasks assessed                                        General Comments      Exercises  Total Joint Exercises Ankle Circles/Pumps: AROM;Both;20 reps;Supine Straight Leg Raises: AAROM;Right;10 reps;Supine   Assessment/Plan    PT Assessment Patient needs continued PT services  PT Problem List Decreased strength;Decreased range of motion;Decreased activity tolerance;Decreased balance;Decreased mobility;Decreased knowledge of use of DME;Pain       PT Treatment Interventions DME instruction;Gait training;Stair  training;Functional mobility training;Therapeutic activities;Therapeutic exercise;Patient/family education;Balance training    PT Goals (Current goals can be found in the Care Plan section)  Acute Rehab PT Goals Patient Stated Goal: Regain IND PT Goal Formulation: With patient Time For Goal Achievement: 02/22/20 Potential to Achieve Goals: Good    Frequency 7X/week   Barriers to discharge        Co-evaluation               AM-PAC PT "6 Clicks" Mobility  Outcome Measure Help needed turning from your back to your side while in a flat bed without using bedrails?: A Little Help needed moving from lying on your back to sitting on the side of a flat bed without using bedrails?: A Little Help needed moving to and from a bed to a chair (including a wheelchair)?: A Lot Help needed standing up from a chair using your arms (e.g., wheelchair or bedside chair)?: A Lot Help needed to walk in hospital room?: A Lot Help needed climbing 3-5 steps with a railing? : A Lot 6 Click Score: 14    End of Session Equipment Utilized During Treatment: Gait belt Activity Tolerance: Patient tolerated treatment well Patient left: in chair;with call bell/phone within reach;with chair alarm set;with family/visitor present Nurse Communication: Mobility status PT Visit Diagnosis: Difficulty in walking, not elsewhere classified (R26.2)    Time: 1824-1900 PT Time Calculation (min) (ACUTE ONLY): 36 min   Charges:   PT Evaluation $PT Eval Low Complexity: 1 Low PT Treatments $Gait Training: 8-22 mins        [place] Pager [phone] Office [phone]   Steffi Noviello 02/15/2020, 7:19 PM

## 2020-02-15 NOTE — Progress Notes (Signed)
Orthopedic Tech Progress Note Patient Details:  Tyshauna Finkbiner 1965/12/12 [id number]  Patient ID: [name], female   DOB: [birth year], 54 y.o.   MRN: [id number]   Kizzie Fantasia 02/15/2020, 6:19 PM cpm removed @1820 

## 2020-02-15 NOTE — Anesthesia Procedure Notes (Signed)
Spinal  Patient location during procedure: OR Start time: 02/15/2020 10:47 AM End time: 02/15/2020 10:50 AM Staffing Performed: resident/CRNA  Resident/CRNA: Nelle Don, CRNA Preanesthetic Checklist Completed: patient identified, IV checked, risks and benefits discussed, surgical consent, monitors and equipment checked and pre-op evaluation Spinal Block Patient position: sitting Prep: DuraPrep Patient monitoring: heart rate, continuous pulse ox and blood pressure Approach: midline Location: L3-4 Injection technique: single-shot Needle Needle type: Pencan  Needle gauge: 24 G Needle length: 9 cm

## 2020-02-16 ENCOUNTER — Other Ambulatory Visit: Payer: Self-pay

## 2020-02-16 DIAGNOSIS — M1711 Unilateral primary osteoarthritis, right knee: Secondary | ICD-10-CM | POA: Diagnosis not present

## 2020-02-16 LAB — CBC
HCT: 34.9 % — ABNORMAL LOW (ref 36.0–46.0)
Hemoglobin: 11.4 g/dL — ABNORMAL LOW (ref 12.0–15.0)
MCH: 29.9 pg (ref 26.0–34.0)
MCHC: 32.7 g/dL (ref 30.0–36.0)
MCV: 91.6 fL (ref 80.0–100.0)
Platelets: 240 10*3/uL (ref 150–400)
RBC: 3.81 MIL/uL — ABNORMAL LOW (ref 3.87–5.11)
RDW: 13.4 % (ref 11.5–15.5)
WBC: 14.5 10*3/uL — ABNORMAL HIGH (ref 4.0–10.5)
nRBC: 0 % (ref 0.0–0.2)

## 2020-02-16 NOTE — Progress Notes (Signed)
Subjective: 1 Day Post-Op Procedure(s) (LRB): TOTAL KNEE ARTHROPLASTY (Right) Patient reports pain as moderate.    Objective: Vital signs in last 24 hours: Temp:  [96 F (35.6 C)-98.6 F (37 C)] 98.2 F (36.8 C) (06/26 0556) Pulse Rate:  [49-78] 50 (06/26 0556) Resp:  [9-18] 15 (06/26 0556) BP: (77-130)/(43-84) 121/69 (06/26 0556) SpO2:  [96 %-100 %] 99 % (06/26 0556)  Intake/Output from previous day: 06/25 0701 - 06/26 0700 In: 4098.5 [P.O.:240; I.V.:3658.5; IV Piggyback:200] Out: 3350 [Urine:3300; Blood:50] Intake/Output this shift: No intake/output data recorded.  Recent Labs    02/16/20 0345  HGB 11.4*   Recent Labs    02/16/20 0345  WBC 14.5*  RBC 3.81*  HCT 34.9*  PLT 240   No results for input(s): NA, K, CL, CO2, BUN, CREATININE, GLUCOSE, CALCIUM in the last 72 hours. No results for input(s): LABPT, INR in the last 72 hours.  Neurologically intact ABD soft Neurovascular intact Sensation intact distally Intact pulses distally Dorsiflexion/Plantar flexion intact No cellulitis present Compartment soft   Assessment/Plan: 1 Day Post-Op Procedure(s) (LRB): TOTAL KNEE ARTHROPLASTY (Right) Advance diet Up with therapy Discharge home with home health    Patient's anticipated LOS is less than 2 midnights, meeting these requirements: - Younger than 71 - Lives within 1 hour of care - Has a competent adult at home to recover with post-op recover - NO history of  - Chronic pain requiring opiods  - Diabetes  - Coronary Artery Disease  - Heart failure  - Heart attack  - Stroke  - DVT/VTE  - Cardiac arrhythmia  - Respiratory Failure/COPD  - Renal failure  - Anemia  - Advanced Liver disease       Harvie Junior 02/16/2020, 9:13 AM

## 2020-02-16 NOTE — TOC Initial Note (Signed)
Transition of Care [facility]) - Initial/Assessment Note    Patient Details  Name: Danielle Pace MRN: [id number] Date of Birth: Feb 08, 1966  Transition of Care (TOC) CM/SW Contact:    Joaquin Courts, RN Phone Number: 02/16/2020, 11:43 AM  Clinical Narrative:                 CM spoke with patient at bedside. [facility] to provide HHPT and aide services.  Patient requests that CM wait on ordering equipment until Monday when insurance can be contacted to confirm the out of pocket cost of equipment for patient.  Patient has equipment that she borrowed from her mother and plans to use.  Depending on the cost of equipment patient may not want to get her own equipment, but continue to use her mothers.  Expected Discharge Plan: [place] Barriers to Discharge: No Barriers Identified   Patient Goals and CMS Choice Patient states their goals for this hospitalization and ongoing recovery are:: to go home CMS Medicare.gov Compare Post Acute Care list provided to:: Patient Choice offered to / list presented to : Patient  Expected Discharge Plan and Services Expected Discharge Plan: [place]   Discharge Planning Services: CM Consult Post Acute Care Choice: [place] arrangements for the past 2 months: Apartment Expected Discharge Date: 02/16/20               DME Arranged:  (see note)         HH Arranged: PT, Nurse's Aide HH Agency: [place] ([place]) Date HH Agency Contacted: 02/16/20 Time [place]: 1140 Representative spoke with at [place]: Corene Cornea  Prior Living Arrangements/Services Living arrangements for the past 2 months: Apartment   Patient language and need for interpreter reviewed:: Yes Do you feel safe going back to the place where you live?: Yes      Need for Family Participation in Patient Care: Yes (Comment) Care giver support system in place?: Yes (comment)   Criminal Activity/Legal Involvement  Pertinent to Current Situation/Hospitalization: No - Comment as needed  Activities of Daily Living Home Assistive Devices/Equipment: Gilford Rile (specify type) ADL Screening (condition at time of admission) Patient's cognitive ability adequate to safely complete daily activities?: Yes Is the patient deaf or have difficulty hearing?: No Does the patient have difficulty seeing, even when wearing glasses/contacts?: No Does the patient have difficulty concentrating, remembering, or making decisions?: No Patient able to express need for assistance with ADLs?: Yes Does the patient have difficulty dressing or bathing?: No Independently performs ADLs?: Yes (appropriate for developmental age) Does the patient have difficulty walking or climbing stairs?: Yes Weakness of Legs: Right Weakness of Arms/Hands: None  Permission Sought/Granted                  Emotional Assessment Appearance:: Appears stated age Attitude/Demeanor/Rapport: Engaged Affect (typically observed): Accepting Orientation: : Oriented to Self, Oriented to Place, Oriented to  Time, Oriented to Situation   Psych Involvement: No (comment)  Admission diagnosis:  Primary osteoarthritis of right knee [M17.11] Patient Active Problem List   Diagnosis Date Noted  . Primary osteoarthritis of right knee 02/15/2020  . RA (rheumatoid arthritis) ([employer])   . Breast hypertrophy in female 11/03/2015  . Vitamin D deficiency 06/27/2014  . Prediabetes 06/27/2014  . Elevated LDL cholesterol level 06/27/2014  . Dyspepsia and other specified disorders of function of stomach 08/08/2013  . Special screening for malignant neoplasms, colon 08/08/2013   PCP:  Isaac Bliss, Olam Idler  Y, MD Pharmacy:   [facility] [place] [facility] [address] [place] - [address] [place] [address] Phone: [phone] Fax: [phone]  [facility] [place], [place] - [address] [facility]  [place] [address] Phone: [phone] Fax: [phone]     Social Determinants of Health (SDOH) Interventions    Readmission Risk Interventions No flowsheet data found.

## 2020-02-16 NOTE — Progress Notes (Signed)
Physical Therapy Treatment Patient Details Name: Danielle Pace MRN: [id number] DOB: Dec 14, 1965 Today's Date: 02/16/2020    History of Present Illness Pt s/p R TKR and with hx of RA    PT Comments    Pt very motivated and progressing well with mobility.  Pt hopeful for dc home later this pm after stair training.   Follow Up Recommendations  Home health PT;Follow surgeon's recommendation for DC plan and follow-up therapies     Equipment Recommendations  Rolling walker with 5" wheels    Recommendations for Other Services       Precautions / Restrictions Precautions Precautions: Knee;Fall Precaution Booklet Issued: No Restrictions Weight Bearing Restrictions: No Other Position/Activity Restrictions: WBAT    Mobility  Bed Mobility               General bed mobility comments: Pt up in chair and requests back to same  Transfers Overall transfer level: Needs assistance Equipment used: Rolling walker (2 wheeled) Transfers: Sit to/from Stand Sit to Stand: Min assist         General transfer comment: cues for LE management and use of UEs to self assist;  Physical assist to bring wt up and fwd and to balance in standing  Ambulation/Gait Ambulation/Gait assistance: Min assist;Min guard Gait Distance (Feet): 120 Feet Assistive device: Rolling walker (2 wheeled) Gait Pattern/deviations: Step-to pattern;Decreased step length - right;Decreased step length - left;Shuffle;Trunk flexed Gait velocity: decr   General Gait Details: cues for sequence, posture and position from [employer]             Wheelchair Mobility    Modified Rankin (Stroke Patients Only)       Balance Overall balance assessment: Needs assistance Sitting-balance support: No upper extremity supported;Feet supported Sitting balance-Leahy Scale: Good     Standing balance support: Bilateral upper extremity supported Standing balance-Leahy Scale: Fair                               Cognition Arousal/Alertness: Awake/alert Behavior During Therapy: WFL for tasks assessed/performed Overall Cognitive Status: Within Functional Limits for tasks assessed                                        Exercises Total Joint Exercises Ankle Circles/Pumps: AROM;Both;20 reps;Supine Quad Sets: AROM;Both;10 reps;Supine Heel Slides: Right;15 reps;Supine;AAROM;AROM Straight Leg Raises: AAROM;Right;Supine;15 reps    General Comments        Pertinent Vitals/Pain Pain Assessment: 0-10 Pain Score: 4  Pain Location: R knee Pain Descriptors / Indicators: Aching;Sore Pain Intervention(s): Limited activity within patient's tolerance;Monitored during session;Premedicated before session;Ice applied    Home Living                      Prior Function            PT Goals (current goals can now be found in the care plan section) Acute Rehab PT Goals Patient Stated Goal: Regain IND PT Goal Formulation: With patient Time For Goal Achievement: 02/22/20 Potential to Achieve Goals: Good Progress towards PT goals: Progressing toward goals    Frequency    7X/week      PT Plan Current plan remains appropriate    Co-evaluation              AM-PAC PT "6 Clicks" Mobility   Outcome  Measure  Help needed turning from your back to your side while in a flat bed without using bedrails?: A Little Help needed moving from lying on your back to sitting on the side of a flat bed without using bedrails?: A Little Help needed moving to and from a bed to a chair (including a wheelchair)?: A Little Help needed standing up from a chair using your arms (e.g., wheelchair or bedside chair)?: A Little Help needed to walk in hospital room?: A Little Help needed climbing 3-5 steps with a railing? : A Lot 6 Click Score: 17    End of Session Equipment Utilized During Treatment: Gait belt Activity Tolerance: Patient tolerated treatment well Patient left: in  chair;with call bell/phone within reach;with chair alarm set;with family/visitor present Nurse Communication: Mobility status PT Visit Diagnosis: Difficulty in walking, not elsewhere classified (R26.2)     Time: 0109-3235 PT Time Calculation (min) (ACUTE ONLY): 32 min  Charges:  $Gait Training: 8-22 mins $Therapeutic Exercise: 8-22 mins                     [place] Pager [phone] Office [phone]    [name] 02/16/2020, 1:10 PM

## 2020-02-16 NOTE — Discharge Summary (Signed)
Patient ID: Danielle Pace MRN: [id number] DOB/AGE: 1966/02/07 54 y.o.  Admit date: 02/15/2020 Discharge date: 02/16/2020  Admission Diagnoses:  Principal Problem:   Primary osteoarthritis of right knee   Discharge Diagnoses:  Same  Past Medical History:  Diagnosis Date  . Arthritis   . Complication of anesthesia   . GERD (gastroesophageal reflux disease)   . Irritable bowel syndrome    occationally  . Meniscus tear    right  . PONV (postoperative nausea and vomiting)   . RA (rheumatoid arthritis) (H[facility])     Surgeries: Procedure(s):RIGHT TOTAL KNEE ARTHROPLASTY on 02/15/2020   Consultants:   Discharged Condition: Improved  Hospital Course: Danielle Pace is an 54 y.o. female who was admitted 02/15/2020 for operative treatment ofPrimary osteoarthritis of right knee. Patient has severe unremitting pain that affects sleep, daily activities, and work/hobbies. After pre-op clearance the patient was taken to the operating room on 02/15/2020 and underwent  Procedure(s):RIGHT TOTAL KNEE ARTHROPLASTY.    Patient was given perioperative antibiotics:  Anti-infectives (From admission, onward)   Start     Dose/Rate Route Frequency Ordered Stop   02/15/20 1700  ceFAZolin (ANCEF) IVPB 2g/100 mL premix        2 g 200 mL/hr over 30 Minutes Intravenous Every 6 hours 02/15/20 1533 02/16/20 0006   02/15/20 0745  ceFAZolin (ANCEF) IVPB 2g/100 mL premix        2 g 200 mL/hr over 30 Minutes Intravenous On call to O.R. 02/15/20 0742 02/15/20 1120       Patient was given sequential compression devices, early ambulation, and chemoprophylaxis to prevent DVT.  Patient benefited maximally from hospital stay and there were no complications.    Recent vital signs:  Patient Vitals for the past 24 hrs:  BP Temp Temp src Pulse Resp SpO2  02/16/20 0556 121/69 98.2 F (36.8 C) -- (!) 50 15 99 %  02/16/20 0202 (!) 111/59 98.4 F (36.9 C) -- (!) 56 15 96 %  02/15/20 2142 117/63 98 F (36.7 C)  -- (!) 52 15 99 %  02/15/20 1828 129/62 97.6 F (36.4 C) Oral 66 16 100 %  02/15/20 1728 125/84 98.6 F (37 C) Oral 61 16 99 %  02/15/20 1636 117/60 -- -- (!) 53 16 100 %  02/15/20 1529 119/71 (!) 97.5 F (36.4 C) -- (!) 58 14 100 %  02/15/20 1515 111/66 (!) 97.3 F (36.3 C) -- 62 12 100 %  02/15/20 1510 104/66 -- -- (!) 49 10 100 %  02/15/20 1504 105/65 -- -- (!) 50 11 99 %  02/15/20 1500 104/64 -- -- (!) 59 11 99 %  02/15/20 1455 (!) 92/58 -- -- (!) 51 10 99 %  02/15/20 1450 (!) 94/54 -- -- (!) 52 11 99 %  02/15/20 1445 (!) 91/57 -- -- (!) 51 11 99 %  02/15/20 1440 (!) 91/55 -- -- (!) 52 10 99 %  02/15/20 1435 (!) 96/59 -- -- (!) 55 (!) 9 98 %  02/15/20 1430 100/61 -- -- 63 18 98 %  02/15/20 1425 (!) 90/53 -- -- (!) 55 14 98 %  02/15/20 1420 (!) 93/53 -- -- (!) 56 17 99 %  02/15/20 1415 (!) 88/53 -- -- (!) 53 12 100 %  02/15/20 1410 (!) 89/50 -- -- (!) 54 12 100 %  02/15/20 1405 (!) 88/49 -- -- (!) 56 12 100 %  02/15/20 1400 (!) 84/48 -- -- (!) 56 12 100 %  02/15/20 1355 (!) 84/49 -- --  61 13 100 %  02/15/20 1350 (!) 83/46 -- -- (!) 58 13 98 %  02/15/20 1345 (!) 77/43 -- -- (!) 59 12 97 %  02/15/20 1331 (!) 87/50 (!) 96 F (35.6 C) -- (!) 54 11 100 %  02/15/20 1020 (!) 91/54 -- -- 70 16 99 %  02/15/20 1012 (!) 79/45 -- -- 78 18 98 %     Recent laboratory studies:  Recent Labs    02/16/20 0345  WBC 14.5*  HGB 11.4*  HCT 34.9*  PLT 240     Discharge Medications:   Allergies as of 02/16/2020      Reactions   Omnipaque [iohexol] Hives, Itching   Other Other (See Comments)   NO BLOOD PRODUCTS      Medication List    STOP taking these medications   ASPERCREME LIDOCAINE EX   BIOFREEZE EX   ibuprofen 200 MG tablet Commonly known as: ADVIL   meloxicam 15 MG tablet Commonly known as: MOBIC   traMADol 50 MG tablet Commonly known as: ULTRAM     TAKE these medications   acetaminophen 650 MG CR tablet Commonly known as: TYLENOL Take 650-1,300 mg by mouth  every 8 (eight) hours as needed for pain.   ALKA SELTZER PLUS PO Take 2 tablets by mouth every 4 (four) hours as needed (cold symptoms.).   aspirin EC 325 MG tablet Take 1 tablet (325 mg total) by mouth 2 (two) times daily after a meal. Take x 1 month post op to decrease risk of blood clots.   aspirin-acetaminophen-caffeine 250-250-65 MG tablet Commonly known as: EXCEDRIN MIGRAINE Take 1 tablet by mouth every 6 (six) hours as needed for headache.   aspirin-sod bicarb-citric acid 325 MG Tbef tablet Commonly known as: ALKA-SELTZER Take 325 mg by mouth every 6 (six) hours as needed (upset stomach).   docusate sodium 100 MG capsule Commonly known as: Colace Take 1 capsule (100 mg total) by mouth 2 (two) times daily.   fluticasone 50 MCG/ACT nasal spray Commonly known as: FLONASE Place 1-2 sprays into both nostrils daily as needed for allergies or rhinitis.   Melatonin 2.5 MG Chew Chew 5 mg by mouth at bedtime as needed (sleep.).   multivitamin with minerals Tabs tablet Take 1 tablet by mouth daily. Alive Women's Multivitamin 50+   Opcon-A 0.027-0.315 % Soln Generic drug: Naphazoline-Pheniramine Place 1-2 drops into both eyes 3 (three) times daily as needed (dry/irritated eyes.).   oxyCODONE-acetaminophen 5-325 MG tablet Commonly known as: PERCOCET/ROXICET Take 1 tablet by mouth every 4 (four) hours as needed for severe pain.   SENOKOT LAXATIVE GUMMIES PO Take 1 tablet by mouth daily as needed (constipation.).   tiZANidine 2 MG tablet Commonly known as: ZANAFLEX Take 1 tablet (2 mg total) by mouth every 8 (eight) hours as needed for muscle spasms.   vitamin B-12 500 MCG tablet Commonly known as: CYANOCOBALAMIN Take 500 mcg by mouth daily.   vitamin C 500 MG tablet Commonly known as: ASCORBIC ACID Take 500 mg by mouth 2 (two) times a week.            Durable Medical Equipment  (From admission, onward)         Start     Ordered   02/15/20 1534  DME Walker  rolling  Once       Question:  Patient needs a walker to treat with the following condition  Answer:  Primary osteoarthritis of right knee   02/15/20 1533   02/15/20  62  DME 3 n 1  Once        02/15/20 1533           Discharge Care Instructions  (From admission, onward)         Start     Ordered   02/16/20 0000  Weight bearing as tolerated       Question Answer Comment  Laterality right   Extremity Lower      02/16/20 1008          Diagnostic Studies: DG Chest 2 View  Result Date: 02/08/2020 CLINICAL DATA:  Preoperative exam. Patient scheduled for knee replacement. EXAM: CHEST - 2 VIEW COMPARISON:  CT abdomen 07/26/2014. Prior chest x-ray report 12/16/2011. FINDINGS: Mediastinum hilar structures normal. Heart size normal. Low lung volumes with bibasilar atelectasis. Mild bilateral interstitial prominence. An active interstitial process including pneumonitis cannot be completely excluded. Follow-up chest x-ray may prove useful for further evaluation. No pleural effusion or pneumothorax. Degenerative change thoracic spine. IMPRESSION: Low lung volumes with bibasilar atelectasis. Mild bilateral interstitial prominence. Active interstitial process including pneumonitis cannot be completely excluded. Follow-up chest x-ray may prove useful for further evaluation. These results will be called to the ordering clinician or representative by the Radiologist Assistant, and communication documented in the PACS or [employer]. Electronically Signed   By: Marcello Moores  Register   On: 02/08/2020 06:18    Disposition: Discharge disposition: 01-Home or Self Care       Discharge Instructions    CPM   Complete by: As directed    Continuous passive motion machine (CPM):      Use the CPM from 0 degrees to 70 degrees for 6-8 hours per day.      You may increase by 10 degrees per day.  You may break it up into 2 or 3 sessions per day.      Use CPM for 1-2 weeks or until you are told to stop.    Call MD / Call 911   Complete by: As directed    If you experience chest pain or shortness of breath, CALL 911 and be transported to the hospital emergency room.  If you develope a fever above 101 F, pus (white drainage) or increased drainage or redness at the wound, or calf pain, call your surgeon's office.   Diet general   Complete by: As directed    Do not put a pillow under the knee. Place it under the heel.   Complete by: As directed    Increase activity slowly as tolerated   Complete by: As directed    TED hose   Complete by: As directed    Use stockings (TED hose) for 2 weeks on both  leg(s).  You may remove them at night for sleeping.   Weight bearing as tolerated   Complete by: As directed    Laterality: right   Extremity: Lower       Follow-up Information    Dorna Leitz, MD. Schedule an appointment as soon as possible for a visit in 2 weeks.   Specialty: Orthopedic Surgery Contact information: [place] [address] [phone]                Signed: Erlene Senters 02/16/2020, 10:08 AM

## 2020-02-16 NOTE — Progress Notes (Signed)
Patient discharged to home w/ family. Given all belongings, instructions, equipment. Verbalized understanding of instructions. Escorted to pov via w/c. 

## 2020-02-16 NOTE — Progress Notes (Signed)
Physical Therapy Treatment Patient Details Name: Danielle Pace MRN: [id number] DOB: [birth year] Today's Date: 02/16/2020    History of Present Illness Pt s/p R TKR and with hx of RA    PT Comments    Pt progressing well with mobility and eager for return home.  Pt ambulated in halls, negotiated stairs and performed HEP with assist - written instruction provided and reviewed.   Follow Up Recommendations  Home health PT;Follow surgeon's recommendation for DC plan and follow-up therapies     Equipment Recommendations  Rolling walker with 5" wheels    Recommendations for Other Services       Precautions / Restrictions Precautions Precautions: Knee;Fall Precaution Booklet Issued: No Restrictions Weight Bearing Restrictions: No Other Position/Activity Restrictions: WBAT    Mobility  Bed Mobility               General bed mobility comments: Pt up in chair and requests back to same  Transfers Overall transfer level: Needs assistance Equipment used: Rolling walker (2 wheeled) Transfers: Sit to/from Stand Sit to Stand: Min guard;Supervision         General transfer comment: cues for LE management and use of UEs to self assist;  Physical assist to bring wt up and fwd and to balance in standing  Ambulation/Gait Ambulation/Gait assistance: Min guard;Supervision Gait Distance (Feet): 120 Feet Assistive device: Rolling walker (2 wheeled) Gait Pattern/deviations: Step-to pattern;Decreased step length - right;Decreased step length - left;Shuffle;Trunk flexed Gait velocity: decr   General Gait Details: cues for sequence, posture and position from RW   Stairs Stairs: Yes Stairs assistance: Min assist Stair Management: One rail Right;Step to pattern;Two rails;Forwards;With crutches Number of Stairs: 17 General stair comments: 15 stairs total with crutch and rail; 2 steps with bil rails; cues for sequence and foot/crutch placement;    Wheelchair Mobility     Modified Rankin (Stroke Patients Only)       Balance Overall balance assessment: Needs assistance Sitting-balance support: No upper extremity supported;Feet supported Sitting balance-Leahy Scale: Good     Standing balance support: Bilateral upper extremity supported Standing balance-Leahy Scale: Fair                              Cognition Arousal/Alertness: Awake/alert Behavior During Therapy: WFL for tasks assessed/performed Overall Cognitive Status: Within Functional Limits for tasks assessed                                        Exercises Total Joint Exercises Ankle Circles/Pumps: AROM;Both;20 reps;Supine Quad Sets: AROM;Both;10 reps;Supine Heel Slides: Right;15 reps;Supine;AAROM;AROM Straight Leg Raises: AAROM;Right;Supine;15 reps Long Arc Quad: AAROM;Right;10 reps;Seated    General Comments        Pertinent Vitals/Pain Pain Assessment: 0-10 Pain Score: 4  Pain Location: R knee Pain Descriptors / Indicators: Aching;Sore Pain Intervention(s): Limited activity within patient's tolerance;Monitored during session;Premedicated before session;Ice applied    Home Living                      Prior Function            PT Goals (current goals can now be found in the care plan section) Acute Rehab PT Goals Patient Stated Goal: Regain IND PT Goal Formulation: With patient Time For Goal Achievement: 02/22/20 Potential to Achieve Goals: Good Progress towards PT goals: Progressing toward goals  Frequency    7X/week      PT Plan Current plan remains appropriate    Co-evaluation              AM-PAC PT "6 Clicks" Mobility   Outcome Measure  Help needed turning from your back to your side while in a flat bed without using bedrails?: A Little Help needed moving from lying on your back to sitting on the side of a flat bed without using bedrails?: A Little Help needed moving to and from a bed to a chair (including  a wheelchair)?: A Little Help needed standing up from a chair using your arms (e.g., wheelchair or bedside chair)?: A Little Help needed to walk in hospital room?: A Little Help needed climbing 3-5 steps with a railing? : A Little 6 Click Score: 18    End of Session Equipment Utilized During Treatment: Gait belt Activity Tolerance: Patient tolerated treatment well Patient left: in chair;with call bell/phone within reach;with chair alarm set;with family/visitor present Nurse Communication: Mobility status PT Visit Diagnosis: Difficulty in walking, not elsewhere classified (R26.2)     Time: 9833-8250 PT Time Calculation (min) (ACUTE ONLY): 42 min  Charges:  $Gait Training: 8-22 mins $Therapeutic Exercise: 8-22 mins $Therapeutic Activity: 8-22 mins                     [place] Pager [phone] Office [phone]    Danielle Pace 02/16/2020, 4:35 PM

## 2020-02-18 ENCOUNTER — Encounter (HOSPITAL_COMMUNITY): Payer: Self-pay | Admitting: Orthopedic Surgery

## 2020-03-02 ENCOUNTER — Ambulatory Visit (HOSPITAL_COMMUNITY)
Admission: EM | Admit: 2020-03-02 | Discharge: 2020-03-02 | Disposition: A | Discharge status: Home / Self Care | Payer: No Typology Code available for payment source | Attending: Family Medicine | Admitting: Family Medicine

## 2020-03-02 ENCOUNTER — Other Ambulatory Visit: Payer: Self-pay

## 2020-03-02 ENCOUNTER — Encounter (HOSPITAL_COMMUNITY): Payer: Self-pay

## 2020-03-02 DIAGNOSIS — L509 Urticaria, unspecified: Secondary | ICD-10-CM

## 2020-03-02 MED ORDER — CETIRIZINE HCL 10 MG PO TABS: 10.0000 mg | ORAL_TABLET | Freq: Every day | ORAL | 0 refills | Status: AC

## 2020-03-02 MED ORDER — PREDNISONE 5 MG PO TABS
ORAL_TABLET | ORAL | 0 refills | Status: DC
Start: 2020-03-02 — End: 2021-01-22

## 2020-03-02 NOTE — ED Provider Notes (Signed)
[facility]    CSN: [id number] Arrival date & time: 03/02/20  1031      History   Chief Complaint Chief Complaint  Patient presents with   Rash    HPI Danielle Pace is a 54 y.o. female.   She is presenting with rash over the extensor surface of her forearms.  It is pruritic in nature.  Initially it started on her hands.  She had a knee arthroplasty on 6/26.  She has not changed any medications since having that procedure done.  Denies exposure to new animals or pets.  No environmental exposures.  Denies any fevers or chills.  No changes in voids or movements.  HPI  Past Medical History:  Diagnosis Date   Arthritis    Complication of anesthesia    GERD (gastroesophageal reflux disease)    Irritable bowel syndrome    occationally   Meniscus tear    right   PONV (postoperative nausea and vomiting)    RA (rheumatoid arthritis) (HCC)     Patient Active Problem List   Diagnosis Date Noted   Primary osteoarthritis of right knee 02/15/2020   RA (rheumatoid arthritis) (HCC)    Breast hypertrophy in female 11/03/2015   Vitamin D deficiency 06/27/2014   Prediabetes 06/27/2014   Elevated LDL cholesterol level 06/27/2014   Dyspepsia and other specified disorders of function of stomach 08/08/2013   Special screening for malignant neoplasms, colon 08/08/2013    Past Surgical History:  Procedure Laterality Date   FOOT SURGERY     JOINT REPLACEMENT     OVARY SURGERY     REDUCTION MAMMAPLASTY  2017   TOTAL KNEE ARTHROPLASTY Right 02/15/2020   Procedure: TOTAL KNEE ARTHROPLASTY;  Surgeon: Jodi Geralds, MD;  Location: [facility];  Service: Orthopedics;  Laterality: Right;    OB History   No obstetric history on file.      Home Medications    Prior to Admission medications   Medication Sig Start Date End Date Taking? Authorizing Provider  aspirin EC 325 MG tablet Take 1 tablet (325 mg total) by mouth 2 (two) times daily after a meal. Take x  1 month post op to decrease risk of blood clots. 02/15/20  Yes Marshia Ly, PA-C  oxyCODONE-acetaminophen (PERCOCET/ROXICET) 5-325 MG tablet Take 1 tablet by mouth every 4 (four) hours as needed for severe pain. 02/15/20  Yes Marshia Ly, PA-C  tiZANidine (ZANAFLEX) 2 MG tablet Take 1 tablet (2 mg total) by mouth every 8 (eight) hours as needed for muscle spasms. 02/15/20  Yes Marshia Ly, PA-C  acetaminophen (TYLENOL) 650 MG CR tablet Take 650-1,300 mg by mouth every 8 (eight) hours as needed for pain.    [provider]  aspirin-acetaminophen-caffeine (EXCEDRIN MIGRAINE) [id number] MG tablet Take 1 tablet by mouth every 6 (six) hours as needed for headache.    [provider]  aspirin-sod bicarb-citric acid (ALKA-SELTZER) 325 MG TBEF tablet Take 325 mg by mouth every 6 (six) hours as needed (upset stomach).    [provider]  cetirizine (ZYRTEC) 10 MG tablet Take 1 tablet (10 mg total) by mouth daily. 03/02/20   Myra Rude, MD  docusate sodium (COLACE) 100 MG capsule Take 1 capsule (100 mg total) by mouth 2 (two) times daily. 02/15/20   Marshia Ly, PA-C  fluticasone (FLONASE) 50 MCG/ACT nasal spray Place 1-2 sprays into both nostrils daily as needed for allergies or rhinitis.    [provider]  Melatonin 2.5 MG CHEW Chew  5 mg by mouth at bedtime as needed (sleep.).    [provider]  Multiple Vitamin (MULITIVITAMIN WITH MINERALS) TABS Take 1 tablet by mouth daily. Alive Women's Multivitamin 50+    [provider]  Naphazoline-Pheniramine (OPCON-A) 0.027-0.315 % SOLN Place 1-2 drops into both eyes 3 (three) times daily as needed (dry/irritated eyes.).    [provider]  Phenyleph-Doxylamine-DM-APAP (ALKA SELTZER PLUS PO) Take 2 tablets by mouth every 4 (four) hours as needed (cold symptoms.).     [provider]  predniSONE (DELTASONE) 5 MG tablet Take 6 pills for first day, 5 pills second day, 4 pills third day,  3 pills fourth day, 2 pills the fifth day, and 1 pill sixth day. 03/02/20   Myra Rude, MD  Senna (SENOKOT LAXATIVE GUMMIES PO) Take 1 tablet by mouth daily as needed (constipation.).    [provider]  vitamin B-12 (CYANOCOBALAMIN) 500 MCG tablet Take 500 mcg by mouth daily.    [provider]  vitamin C (ASCORBIC ACID) 500 MG tablet Take 500 mg by mouth 2 (two) times a week.     [provider]    Family History Family History  Problem Relation Age of Onset   Breast cancer Mother    Diabetes Mother    Hypertension Mother    Kidney disease Sister    Colon polyps Sister        x 2 sisters    Social History Social History   Tobacco Use   Smoking status: Never Smoker   Smokeless tobacco: Never Used  Building services engineer Use: Never used  Substance Use Topics   Alcohol use: No   Drug use: No     Allergies   Omnipaque [iohexol] and Other   Review of Systems Review of Systems  See HPI  Physical Exam Triage Vital Signs ED Triage Vitals  Enc Vitals Group     BP 03/02/20 1042 (!) 151/75     Pulse Rate 03/02/20 1042 88     Resp 03/02/20 1042 18     Temp 03/02/20 1042 98.6 F (37 C)     Temp src --      SpO2 03/02/20 1041 99 %     Weight --      Height --      Head Circumference --      Peak Flow --      Pain Score 03/02/20 1039 0     Pain Loc --      Pain Edu? --      Excl. in GC? --    No data found.  Updated Vital Signs BP (!) 151/75 (BP Location: Right Arm)    Pulse 88    Temp 98.6 F (37 C)    Resp 18    LMP 05/29/2014    SpO2 99%   Visual Acuity Right Eye Distance:   Left Eye Distance:   Bilateral Distance:    Right Eye Near:   Left Eye Near:    Bilateral Near:     Physical Exam Gen: NAD, alert, cooperative with exam, well-appearing ENT: normal lips, normal nasal mucosa,  Eye: normal EOM, normal conjunctiva and lids Skin: There are raised erythematous plaques over the extensor surface of her  forearms.    UC Treatments / Results  Labs (all labs ordered are listed, but only abnormal results are displayed) Labs Reviewed - No data to display  EKG   Radiology No results found.  Procedures  Procedures (including critical care time)  Medications Ordered in UC Medications - No data to display  Initial Impression / Assessment and Plan / UC Course  I have reviewed the triage vital signs and the nursing notes.  Pertinent labs & imaging results that were available during my care of the patient were reviewed by me and considered in my medical decision making (see chart for details).     Ms. Franta is a 54 year old female is presenting with urticaria.  Seems less likely for drug reaction.  Counseled on taking Zyrtec and Benadryl.  Provided prednisone if her symptoms fail to improve.  Counseled on follow-up.  Final Clinical Impressions(s) / UC Diagnoses   Final diagnoses:  Urticaria     Discharge Instructions     Please take zyrtec during the day  Please take benadryl at night  Please use the prednisone if your symptoms fail to improve.  Please follow up if your symptoms are not improving.      ED Prescriptions    Medication Sig Dispense Auth. Provider   cetirizine (ZYRTEC) 10 MG tablet Take 1 tablet (10 mg total) by mouth daily. 30 tablet Myra Rude, MD   predniSONE (DELTASONE) 5 MG tablet Take 6 pills for first day, 5 pills second day, 4 pills third day, 3 pills fourth day, 2 pills the fifth day, and 1 pill sixth day. 21 tablet Myra Rude, MD     PDMP not reviewed this encounter.   Myra Rude, MD 03/02/20 1134

## 2020-03-02 NOTE — ED Triage Notes (Signed)
Pt c/o itchy rash to hands onset yesterday, took Benadryl po and applied calamine lotion, then rash spread to arms and back of upper legs.   Large areas of welts noted to BUE. Denies SOB, difficulty swallowing, or facial edema.  Pt has been taking oxycodone for pain management s/p total knee replacement on 02/16/20. Denies any change to laundry detergents, soaps, foods, etc.

## 2020-03-02 NOTE — Discharge Instructions (Signed)
Please take zyrtec during the day  Please take benadryl at night  Please use the prednisone if your symptoms fail to improve.  Please follow up if your symptoms are not improving.

## 2020-04-30 ENCOUNTER — Other Ambulatory Visit: Payer: Self-pay | Admitting: Internal Medicine

## 2020-04-30 DIAGNOSIS — Z1231 Encounter for screening mammogram for malignant neoplasm of breast: Secondary | ICD-10-CM

## 2020-05-06 ENCOUNTER — Other Ambulatory Visit: Payer: Self-pay

## 2020-05-06 ENCOUNTER — Ambulatory Visit
Admission: RE | Admit: 2020-05-06 | Discharge: 2020-05-06 | Disposition: A | Discharge status: Home / Self Care | Payer: No Typology Code available for payment source | Source: Ambulatory Visit | Attending: Internal Medicine | Admitting: Internal Medicine

## 2020-05-06 DIAGNOSIS — Z1231 Encounter for screening mammogram for malignant neoplasm of breast: Secondary | ICD-10-CM

## 2020-06-06 ENCOUNTER — Telehealth: Payer: Self-pay | Admitting: Internal Medicine

## 2020-06-06 DIAGNOSIS — Z0100 Encounter for examination of eyes and vision without abnormal findings: Secondary | ICD-10-CM

## 2020-06-06 NOTE — Telephone Encounter (Signed)
[facility] Dr. Marcille Buffy [address]  Patient needs a referral to this eye doctor because of her insurance  Please advise

## 2020-06-09 ENCOUNTER — Ambulatory Visit (INDEPENDENT_AMBULATORY_CARE_PROVIDER_SITE_OTHER): Payer: Self-pay

## 2020-06-09 ENCOUNTER — Ambulatory Visit (INDEPENDENT_AMBULATORY_CARE_PROVIDER_SITE_OTHER): Payer: Self-pay | Admitting: Podiatry

## 2020-06-09 ENCOUNTER — Other Ambulatory Visit: Payer: Self-pay

## 2020-06-09 ENCOUNTER — Other Ambulatory Visit: Payer: Self-pay | Admitting: Podiatry

## 2020-06-09 DIAGNOSIS — M21172 Varus deformity, not elsewhere classified, left ankle: Secondary | ICD-10-CM

## 2020-06-09 DIAGNOSIS — M659 Synovitis and tenosynovitis, unspecified: Secondary | ICD-10-CM

## 2020-06-09 DIAGNOSIS — S9030XA Contusion of unspecified foot, initial encounter: Secondary | ICD-10-CM

## 2020-06-09 DIAGNOSIS — M722 Plantar fascial fibromatosis: Secondary | ICD-10-CM

## 2020-06-09 NOTE — Progress Notes (Signed)
   Subjective:  54 y.o. female presenting today for evaluation of left foot and ankle pain is been going on for several months now.  Patient recently got a new job working at the [employer] and she is on her feet all day long.  She is slowly developed pain and sensitivity to the feet.  She has not done anything for treatment other than OTC arch cushions which provide minimal relief..  She presents for further treatment evaluation   Past Medical History:  Diagnosis Date  . Arthritis   . Complication of anesthesia   . GERD (gastroesophageal reflux disease)   . Irritable bowel syndrome    occationally  . Meniscus tear    right  . PONV (postoperative nausea and vomiting)   . RA (rheumatoid arthritis) (HCC)      Objective / Physical Exam:  General:  The patient is alert and oriented x3 in no acute distress. Dermatology:  Skin is warm, dry and supple bilateral lower extremities. Negative for open lesions or macerations. Vascular:  Palpable pedal pulses bilaterally. No edema or erythema noted. Capillary refill within normal limits. Neurological:  Epicritic and protective threshold grossly intact bilaterally.  Musculoskeletal Exam:  Pain on palpation to the anterior lateral medial aspects of the patient's left ankle. Mild edema noted.  There is also pain on palpation of the medial aspect of the heel consistent with plantar fascial pain.  Range of motion within normal limits to all pedal and ankle joints bilateral. Muscle strength 5/5 in all groups bilateral.  Also noted is an elevated first ray.  Forefoot varus noted.  Reducible.  Radiographic Exam:  Normal osseous mineralization. Joint spaces preserved. No fracture/dislocation/boney destruction.  Elevatus of the first ray noted on lateral view.  Assessment: 1.  Elevatus first ray left foot 2.  Synovitis/capsulitis left ankle 3.  Plantar fasciitis left  Plan of Care:  1. Patient was evaluated. X-Rays  reviewed.  2. Injection of 0.5 mL Celestone Soluspan injected in the patient's left plantar fascia and ankle. 3.  OTC power step insoles provided 4.  Ankle brace provided.  Wear daily 5.  Patient takes Tylenol arthritis.  Continue as needed 6.  Return to clinic in 4 weeks   Felecia Shelling, DPM [facility]  Dr. Felecia Shelling, DPM    [address]                                        [place], [place] [address]                Office [phone]  Fax [phone]

## 2020-06-09 NOTE — Telephone Encounter (Signed)
Okay for referral?

## 2020-06-10 NOTE — Telephone Encounter (Signed)
Yes

## 2020-06-10 NOTE — Telephone Encounter (Signed)
Referral entered  

## 2020-06-10 NOTE — Addendum Note (Signed)
Addended by: Kathreen Devoid on: 06/10/2020 07:08 AM   Modules accepted: Orders

## 2020-07-21 ENCOUNTER — Ambulatory Visit: Payer: Self-pay | Admitting: Podiatry

## 2020-07-31 ENCOUNTER — Other Ambulatory Visit: Payer: Self-pay | Admitting: Podiatry

## 2020-07-31 ENCOUNTER — Ambulatory Visit (INDEPENDENT_AMBULATORY_CARE_PROVIDER_SITE_OTHER): Payer: No Typology Code available for payment source | Admitting: Podiatry

## 2020-07-31 ENCOUNTER — Ambulatory Visit (INDEPENDENT_AMBULATORY_CARE_PROVIDER_SITE_OTHER): Payer: No Typology Code available for payment source

## 2020-07-31 ENCOUNTER — Encounter: Payer: Self-pay | Admitting: Podiatry

## 2020-07-31 ENCOUNTER — Other Ambulatory Visit: Payer: Self-pay

## 2020-07-31 DIAGNOSIS — M21172 Varus deformity, not elsewhere classified, left ankle: Secondary | ICD-10-CM | POA: Diagnosis not present

## 2020-07-31 DIAGNOSIS — M79672 Pain in left foot: Secondary | ICD-10-CM | POA: Diagnosis not present

## 2020-07-31 DIAGNOSIS — M76821 Posterior tibial tendinitis, right leg: Secondary | ICD-10-CM

## 2020-07-31 DIAGNOSIS — M76822 Posterior tibial tendinitis, left leg: Secondary | ICD-10-CM | POA: Diagnosis not present

## 2020-07-31 DIAGNOSIS — M79671 Pain in right foot: Secondary | ICD-10-CM | POA: Diagnosis not present

## 2020-07-31 NOTE — Progress Notes (Signed)
Subjective:   Patient ID: Danielle Pace, female   DOB: 54 y.o.   MRN: [id number]   HPI Patient presents stating she has developed pain in the inside of her left ankle and she just has had chronic pain in her foot and ankle for years that has intensified after her knee surgery in June.  She has had a fixed deformity of this foot since she was born and states that she is done the best she can   ROS      Objective:  Physical Exam  Neurovascular status is intact with patient had a fixed forefoot varus deformity with pathology under good foot and into the ankle with no indications of compensatory ankle equinus.  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 Lilian Kapur.  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  We are desperately trying to avoid surgery and at this point we are going to put her into a miso brace to try to stabilize her ankle subtalar joint and then hopefully prevent needing surgery of the foot and the ped orthotist did evaluate this agrees and casted.  Patient will be seen back.  I did go ahead today and I injected the posterior tibial tendon as it comes underneath the malleolus inserts navicular and I dispensed air fracture walker to try to stabilize her structure X-ray indicates that there is a forefoot varus deformity that is fixed and is present with weightbearing

## 2020-08-08 ENCOUNTER — Telehealth: Payer: Self-pay | Admitting: Podiatry

## 2020-08-08 NOTE — Telephone Encounter (Signed)
Pt left message yesterday @ 456pm asking if her insurance approved the orthotics.   I returned call today and left message that the [employer] plan does not cover orthotics and to call back if any further questions

## 2020-09-01 ENCOUNTER — Telehealth: Payer: Self-pay | Admitting: Podiatrist

## 2020-10-02 ENCOUNTER — Telehealth: Payer: Self-pay | Admitting: Sports Medicine

## 2020-10-02 NOTE — Telephone Encounter (Signed)
Patient called inquiring about her moms surgery

## 2020-12-08 NOTE — Telephone Encounter (Signed)
Telephone call returned to patient

## 2021-01-22 ENCOUNTER — Ambulatory Visit (INDEPENDENT_AMBULATORY_CARE_PROVIDER_SITE_OTHER): Payer: No Typology Code available for payment source | Admitting: Internal Medicine

## 2021-01-22 ENCOUNTER — Other Ambulatory Visit: Payer: Self-pay

## 2021-01-22 ENCOUNTER — Encounter: Payer: Self-pay | Admitting: Internal Medicine

## 2021-01-22 VITALS — BP 110/70 | HR 79 | Temp 98.2°F | Ht 59.5 in | Wt 164.0 lb

## 2021-01-22 DIAGNOSIS — Z23 Encounter for immunization: Secondary | ICD-10-CM

## 2021-01-22 DIAGNOSIS — Z0000 Encounter for general adult medical examination without abnormal findings: Secondary | ICD-10-CM | POA: Diagnosis not present

## 2021-01-22 DIAGNOSIS — R7303 Prediabetes: Secondary | ICD-10-CM | POA: Diagnosis not present

## 2021-01-22 DIAGNOSIS — E559 Vitamin D deficiency, unspecified: Secondary | ICD-10-CM | POA: Diagnosis not present

## 2021-01-22 DIAGNOSIS — M069 Rheumatoid arthritis, unspecified: Secondary | ICD-10-CM

## 2021-01-22 DIAGNOSIS — E7800 Pure hypercholesterolemia, unspecified: Secondary | ICD-10-CM

## 2021-01-22 NOTE — Patient Instructions (Signed)
-Nice seeing you today!!  -Lab work today; will notify you once results are available.  -Schedule follow up in 1 year or sooner as needed.   Preventive Care 40-56 Years Old, Female Preventive care refers to lifestyle choices and visits with your health care provider that can promote health and wellness. This includes:  A yearly physical exam. This is also called an annual wellness visit.  Regular dental and eye exams.  Immunizations.  Screening for certain conditions.  Healthy lifestyle choices, such as: ? Eating a healthy diet. ? Getting regular exercise. ? Not using drugs or products that contain nicotine and tobacco. ? Limiting alcohol use. What can I expect for my preventive care visit? Physical exam Your health care provider will check your:  Height and weight. These may be used to calculate your BMI (body mass index). BMI is a measurement that tells if you are at a healthy weight.  Heart rate and blood pressure.  Body temperature.  Skin for abnormal spots. Counseling Your health care provider may ask you questions about your:  Past medical problems.  Family's medical history.  Alcohol, tobacco, and drug use.  Emotional well-being.  Home life and relationship well-being.  Sexual activity.  Diet, exercise, and sleep habits.  Work and work environment.  Access to firearms.  Method of birth control.  Menstrual cycle.  Pregnancy history. What immunizations do I need? Vaccines are usually given at various ages, according to a schedule. Your health care provider will recommend vaccines for you based on your age, medical history, and lifestyle or other factors, such as travel or where you work.   What tests do I need? Blood tests  Lipid and cholesterol levels. These may be checked every 5 years, or more often if you are over 56 years old.  Hepatitis C test.  Hepatitis B test. Screening  Lung cancer screening. You may have this screening every year  starting at age 56 if you have a 30-pack-year history of smoking and currently smoke or have quit within the past 15 years.  Colorectal cancer screening. ? All adults should have this screening starting at age 56 and continuing until age 75. ? Your health care provider may recommend screening at age 56 if you are at increased risk. ? You will have tests every 1-10 years, depending on your results and the type of screening test.  Diabetes screening. ? This is done by checking your blood sugar (glucose) after you have not eaten for a while (fasting). ? You may have this done every 1-3 years.  Mammogram. ? This may be done every 1-2 years. ? Talk with your health care provider about when you should start having regular mammograms. This may depend on whether you have a family history of breast cancer.  BRCA-related cancer screening. This may be done if you have a family history of breast, ovarian, tubal, or peritoneal cancers.  Pelvic exam and Pap test. ? This may be done every 3 years starting at age 21. ? Starting at age 56, this may be done every 5 years if you have a Pap test in combination with an HPV test. Other tests  STD (sexually transmitted disease) testing, if you are at risk.  Bone density scan. This is done to screen for osteoporosis. You may have this scan if you are at high risk for osteoporosis. Talk with your health care provider about your test results, treatment options, and if necessary, the need for more tests. Follow these instructions at   home: Eating and drinking  Eat a diet that includes fresh fruits and vegetables, whole grains, lean protein, and low-fat dairy products.  Take vitamin and mineral supplements as recommended by your health care provider.  Do not drink alcohol if: ? Your health care provider tells you not to drink. ? You are pregnant, may be pregnant, or are planning to become pregnant.  If you drink alcohol: ? Limit how much you have to 0-1  drink a day. ? Be aware of how much alcohol is in your drink. In the [place] one drink equals one 12 oz bottle of beer (355 mL), one 5 oz glass of wine (148 mL), or one 1 oz glass of hard liquor (44 mL).   Lifestyle  Take daily care of your teeth and gums. Brush your teeth every morning and night with fluoride toothpaste. Floss one time each day.  Stay active. Exercise for at least 30 minutes 5 or more days each week.  Do not use any products that contain nicotine or tobacco, such as cigarettes, e-cigarettes, and chewing tobacco. If you need help quitting, ask your health care provider.  Do not use drugs.  If you are sexually active, practice safe sex. Use a condom or other form of protection to prevent STIs (sexually transmitted infections).  If you do not wish to become pregnant, use a form of birth control. If you plan to become pregnant, see your health care provider for a prepregnancy visit.  If told by your health care provider, take low-dose aspirin daily starting at age 56.  Find healthy ways to cope with stress, such as: ? Meditation, yoga, or listening to music. ? Journaling. ? Talking to a trusted person. ? Spending time with friends and family. Safety  Always wear your seat belt while driving or riding in a vehicle.  Do not drive: ? If you have been drinking alcohol. Do not ride with someone who has been drinking. ? When you are tired or distracted. ? While texting.  Wear a helmet and other protective equipment during sports activities.  If you have firearms in your house, make sure you follow all gun safety procedures. What's next?  Visit your health care provider once a year for an annual wellness visit.  Ask your health care provider how often you should have your eyes and teeth checked.  Stay up to date on all vaccines. This information is not intended to replace advice given to you by your health care provider. Make sure you discuss any questions you have  with your health care provider. Document Revised: 05/13/2020 Document Reviewed: 04/20/2018 Elsevier Patient Education  2021 [employer].

## 2021-01-22 NOTE — Progress Notes (Signed)
Established Patient Office Visit     This visit occurred during the SARS-CoV-2 public health emergency.  Safety protocols were in place, including screening questions prior to the visit, additional usage of staff PPE, and extensive cleaning of exam room while observing appropriate contact time as indicated for disinfecting solutions.    CC/Reason for Visit: Annual preventive exam  HPI: Danielle Pace is a 55 y.o. female who is coming in today for the above mentioned reasons. Past Medical History is significant for: Rheumatoid arthritis, status post right knee replacement in the summer 2021.  She has routine eye care but no dental care.  She had a colonoscopy in 2015, she had a mammogram in September 2021, she follows routinely with her GYN for Pap smears, last in 2021.  She has had 3 COVID vaccines is due for her second booster, she had a Tdap in 2019, she is overdue for shingles vaccine.  She is interested in following again with rheumatology due to multiple joint pain.   Past Medical/Surgical History: Past Medical History:  Diagnosis Date  . Arthritis   . Complication of anesthesia   . GERD (gastroesophageal reflux disease)   . Irritable bowel syndrome    occationally  . Meniscus tear    right  . PONV (postoperative nausea and vomiting)   . RA (rheumatoid arthritis) ([place])     Past Surgical History:  Procedure Laterality Date  . FOOT SURGERY    . JOINT REPLACEMENT    . OVARY SURGERY    . REDUCTION MAMMAPLASTY  2017  . TOTAL KNEE ARTHROPLASTY Right 02/15/2020   Procedure: TOTAL KNEE ARTHROPLASTY;  Surgeon: Dorna Leitz, MD;  Location: [facility];  Service: Orthopedics;  Laterality: Right;    Social History:  reports that she has never smoked. She has never used smokeless tobacco. She reports that she does not drink alcohol and does not use drugs.  Allergies: Allergies  Allergen Reactions  . Omnipaque [Iohexol] Hives and Itching  . Other Other (See Comments)    NO  BLOOD PRODUCTS     Family History:  Family History  Problem Relation Age of Onset  . Breast cancer Mother   . Diabetes Mother   . Hypertension Mother   . Kidney disease Sister   . Colon polyps Sister        x 2 sisters     Current Outpatient Medications:  .  acetaminophen (TYLENOL) 650 MG CR tablet, Take 650-1,300 mg by mouth every 8 (eight) hours as needed for pain., Disp: , Rfl:  .  aspirin EC 325 MG tablet, Take 1 tablet (325 mg total) by mouth 2 (two) times daily after a meal. Take x 1 month post op to decrease risk of blood clots., Disp: 60 tablet, Rfl: 0 .  aspirin-acetaminophen-caffeine (EXCEDRIN MIGRAINE) 250-250-65 MG tablet, Take 1 tablet by mouth every 6 (six) hours as needed for headache., Disp: , Rfl:  .  aspirin-sod bicarb-citric acid (ALKA-SELTZER) 325 MG TBEF tablet, Take 325 mg by mouth every 6 (six) hours as needed (upset stomach)., Disp: , Rfl:  .  cetirizine (ZYRTEC) 10 MG tablet, Take 1 tablet (10 mg total) by mouth daily., Disp: 30 tablet, Rfl: 0 .  docusate sodium (COLACE) 100 MG capsule, Take 1 capsule (100 mg total) by mouth 2 (two) times daily., Disp: 30 capsule, Rfl: 0 .  fluticasone (FLONASE) 50 MCG/ACT nasal spray, Place 1-2 sprays into both nostrils daily as needed for allergies or rhinitis., Disp: , Rfl:  .  Melatonin 2.5 MG CHEW, Chew 5 mg by mouth at bedtime as needed (sleep.)., Disp: , Rfl:  .  Multiple Vitamin (MULITIVITAMIN WITH MINERALS) TABS, Take 1 tablet by mouth daily. Alive Women's Multivitamin 50+, Disp: , Rfl:  .  Naphazoline-Pheniramine (OPCON-A) 0.027-0.315 % SOLN, Place 1-2 drops into both eyes 3 (three) times daily as needed (dry/irritated eyes.)., Disp: , Rfl:  .  Phenyleph-Doxylamine-DM-APAP (ALKA SELTZER PLUS PO), Take 2 tablets by mouth every 4 (four) hours as needed (cold symptoms.). , Disp: , Rfl:  .  Senna (SENOKOT LAXATIVE GUMMIES PO), Take 1 tablet by mouth daily as needed (constipation.)., Disp: , Rfl:  .  vitamin C (ASCORBIC  ACID) 500 MG tablet, Take 500 mg by mouth 2 (two) times a week. , Disp: , Rfl:   Review of Systems:  Constitutional: Denies fever, chills, diaphoresis, appetite change and fatigue.  HEENT: Denies photophobia, eye pain, redness, hearing loss, ear pain, congestion, sore throat, rhinorrhea, sneezing, mouth sores, trouble swallowing, neck pain, neck stiffness and tinnitus.   Respiratory: Denies SOB, DOE, cough, chest tightness,  and wheezing.   Cardiovascular: Denies chest pain, palpitations and leg swelling.  Gastrointestinal: Denies nausea, vomiting, abdominal pain, diarrhea, constipation, blood in stool and abdominal distention.  Genitourinary: Denies dysuria, urgency, frequency, hematuria, flank pain and difficulty urinating.  Endocrine: Denies: hot or cold intolerance, sweats, changes in hair or nails, polyuria, polydipsia. Musculoskeletal: Denies myalgias, back pain and gait problem.  Skin: Denies pallor, rash and wound.  Neurological: Denies dizziness, seizures, syncope, weakness, light-headedness, numbness and headaches.  Hematological: Denies adenopathy. Easy bruising, personal or family bleeding history  Psychiatric/Behavioral: Denies suicidal ideation, mood changes, confusion, nervousness, sleep disturbance and agitation    Physical Exam: Vitals:   01/22/21 1606  BP: 110/70  Pulse: 79  Temp: 98.2 F (36.8 C)  TempSrc: Oral  SpO2: 96%  Weight: 164 lb (74.4 kg)  Height: 4' 11.5" (1.511 m)    Body mass index is 32.57 kg/m.   Constitutional: NAD, calm, comfortable Eyes: PERRL, lids and conjunctivae normal ENMT: Mucous membranes are moist. Posterior pharynx clear of any exudate or lesions. Normal dentition. Tympanic membrane is pearly white, no erythema or bulging. Neck: normal, supple, no masses, no thyromegaly Respiratory: clear to auscultation bilaterally, no wheezing, no crackles. Normal respiratory effort. No accessory muscle use.  Cardiovascular: Regular rate and  rhythm, no murmurs / rubs / gallops. No extremity edema. 2+ pedal pulses. No carotid bruits.  Abdomen: no tenderness, no masses palpated. No hepatosplenomegaly. Bowel sounds positive.  Musculoskeletal: no clubbing / cyanosis. No joint deformity upper and lower extremities. Good ROM, no contractures. Normal muscle tone.  Skin: no rashes, lesions, ulcers. No induration Neurologic: CN 2-12 grossly intact. Sensation intact, DTR normal. Strength 5/5 in all 4.  Psychiatric: Normal judgment and insight. Alert and oriented x 3. Normal mood.    Impression and Plan:  Encounter for preventive health examination -Advised routine eye and dental care. -She will get her second COVID booster at the pharmacy.  First shingles vaccine administered today.  Otherwise immunizations are up-to-date. -Screening labs ordered. -Healthy lifestyle discussed in detail. -She had a colonoscopy in 2015 and is a 10-year callback. -She had a normal mammogram in September 2021. -She follows with GYN for Pap smears, last in 2021.  Vitamin D deficiency  - Plan: VITAMIN D 25 Hydroxy (Vit-D Deficiency, Fractures)  Prediabetes  -Check A1c today.  Elevated LDL cholesterol level  - Plan: Lipid panel -Not on statin therapy.  Rheumatoid arthritis involving  multiple sites, unspecified whether rheumatoid factor present ([place]) -Referred to rheumatology, not currently on DMARDs.  Need for shingles vaccine -For shingles vaccine administered today.   Patient Instructions   -Nice seeing you today!!  -Lab work today; will notify you once results are available.  -Schedule follow up in 1 year or sooner as needed.   Preventive Care 28-4 Years Old, Female Preventive care refers to lifestyle choices and visits with your health care provider that can promote health and wellness. This includes:  A yearly physical exam. This is also called an annual wellness visit.  Regular dental and eye exams.  Immunizations.  Screening  for certain conditions.  Healthy lifestyle choices, such as: ? Eating a healthy diet. ? Getting regular exercise. ? Not using drugs or products that contain nicotine and tobacco. ? Limiting alcohol use. What can I expect for my preventive care visit? Physical exam Your health care provider will check your:  Height and weight. These may be used to calculate your BMI (body mass index). BMI is a measurement that tells if you are at a healthy weight.  Heart rate and blood pressure.  Body temperature.  Skin for abnormal spots. Counseling Your health care provider may ask you questions about your:  Past medical problems.  Family's medical history.  Alcohol, tobacco, and drug use.  Emotional well-being.  Home life and relationship well-being.  Sexual activity.  Diet, exercise, and sleep habits.  Work and work Statistician.  Access to firearms.  Method of birth control.  Menstrual cycle.  Pregnancy history. What immunizations do I need? Vaccines are usually given at various ages, according to a schedule. Your health care provider will recommend vaccines for you based on your age, medical history, and lifestyle or other factors, such as travel or where you work.   What tests do I need? Blood tests  Lipid and cholesterol levels. These may be checked every 5 years, or more often if you are over 29 years old.  Hepatitis C test.  Hepatitis B test. Screening  Lung cancer screening. You may have this screening every year starting at age 33 if you have a 30-pack-year history of smoking and currently smoke or have quit within the past 15 years.  Colorectal cancer screening. ? All adults should have this screening starting at age 20 and continuing until age 7. ? Your health care provider may recommend screening at age 21 if you are at increased risk. ? You will have tests every 1-10 years, depending on your results and the type of screening test.  Diabetes  screening. ? This is done by checking your blood sugar (glucose) after you have not eaten for a while (fasting). ? You may have this done every 1-3 years.  Mammogram. ? This may be done every 1-2 years. ? Talk with your health care provider about when you should start having regular mammograms. This may depend on whether you have a family history of breast cancer.  BRCA-related cancer screening. This may be done if you have a family history of breast, ovarian, tubal, or peritoneal cancers.  Pelvic exam and Pap test. ? This may be done every 3 years starting at age 96. ? Starting at age 12, this may be done every 5 years if you have a Pap test in combination with an HPV test. Other tests  STD (sexually transmitted disease) testing, if you are at risk.  Bone density scan. This is done to screen for osteoporosis. You may have this scan  if you are at high risk for osteoporosis. Talk with your health care provider about your test results, treatment options, and if necessary, the need for more tests. Follow these instructions at home: Eating and drinking  Eat a diet that includes fresh fruits and vegetables, whole grains, lean protein, and low-fat dairy products.  Take vitamin and mineral supplements as recommended by your health care provider.  Do not drink alcohol if: ? Your health care provider tells you not to drink. ? You are pregnant, may be pregnant, or are planning to become pregnant.  If you drink alcohol: ? Limit how much you have to 0-1 drink a day. ? Be aware of how much alcohol is in your drink. In the [place] one drink equals one 12 oz bottle of beer (355 mL), one 5 oz glass of wine (148 mL), or one 1 oz glass of hard liquor (44 mL).   Lifestyle  Take daily care of your teeth and gums. Brush your teeth every morning and night with fluoride toothpaste. Floss one time each day.  Stay active. Exercise for at least 30 minutes 5 or more days each week.  Do not use any  products that contain nicotine or tobacco, such as cigarettes, e-cigarettes, and chewing tobacco. If you need help quitting, ask your health care provider.  Do not use drugs.  If you are sexually active, practice safe sex. Use a condom or other form of protection to prevent STIs (sexually transmitted infections).  If you do not wish to become pregnant, use a form of birth control. If you plan to become pregnant, see your health care provider for a prepregnancy visit.  If told by your health care provider, take low-dose aspirin daily starting at age 68.  Find healthy ways to cope with stress, such as: ? Meditation, yoga, or listening to music. ? Journaling. ? Talking to a trusted person. ? Spending time with friends and family. Safety  Always wear your seat belt while driving or riding in a vehicle.  Do not drive: ? If you have been drinking alcohol. Do not ride with someone who has been drinking. ? When you are tired or distracted. ? While texting.  Wear a helmet and other protective equipment during sports activities.  If you have firearms in your house, make sure you follow all gun safety procedures. What's next?  Visit your health care provider once a year for an annual wellness visit.  Ask your health care provider how often you should have your eyes and teeth checked.  Stay up to date on all vaccines. This information is not intended to replace advice given to you by your health care provider. Make sure you discuss any questions you have with your health care provider. Document Revised: 05/13/2020 Document Reviewed: 04/20/2018 Elsevier Patient Education  2021 [place], MD [facility]

## 2021-01-26 ENCOUNTER — Other Ambulatory Visit (INDEPENDENT_AMBULATORY_CARE_PROVIDER_SITE_OTHER): Payer: No Typology Code available for payment source

## 2021-01-26 ENCOUNTER — Other Ambulatory Visit: Payer: Self-pay

## 2021-01-26 DIAGNOSIS — E559 Vitamin D deficiency, unspecified: Secondary | ICD-10-CM

## 2021-01-26 DIAGNOSIS — E7800 Pure hypercholesterolemia, unspecified: Secondary | ICD-10-CM

## 2021-01-26 DIAGNOSIS — R7303 Prediabetes: Secondary | ICD-10-CM

## 2021-01-26 DIAGNOSIS — Z0000 Encounter for general adult medical examination without abnormal findings: Secondary | ICD-10-CM | POA: Diagnosis not present

## 2021-01-26 LAB — CBC WITH DIFFERENTIAL/PLATELET
Basophils Absolute: 0.1 10*3/uL (ref 0.0–0.1)
Basophils Relative: 1 % (ref 0.0–3.0)
Eosinophils Absolute: 0.4 10*3/uL (ref 0.0–0.7)
Eosinophils Relative: 5.1 % — ABNORMAL HIGH (ref 0.0–5.0)
HCT: 39.7 % (ref 36.0–46.0)
Hemoglobin: 13.4 g/dL (ref 12.0–15.0)
Lymphocytes Relative: 24.3 % (ref 12.0–46.0)
Lymphs Abs: 1.7 10*3/uL (ref 0.7–4.0)
MCHC: 33.7 g/dL (ref 30.0–36.0)
MCV: 88.7 fl (ref 78.0–100.0)
Monocytes Absolute: 0.5 10*3/uL (ref 0.1–1.0)
Monocytes Relative: 7.6 % (ref 3.0–12.0)
Neutro Abs: 4.4 10*3/uL (ref 1.4–7.7)
Neutrophils Relative %: 62 % (ref 43.0–77.0)
Platelets: 264 10*3/uL (ref 150.0–400.0)
RBC: 4.48 Mil/uL (ref 3.87–5.11)
RDW: 14.2 % (ref 11.5–15.5)
WBC: 7.1 10*3/uL (ref 4.0–10.5)

## 2021-01-26 LAB — HEMOGLOBIN A1C: Hgb A1c MFr Bld: 6 % (ref 4.6–6.5)

## 2021-01-26 LAB — TSH: TSH: 1.99 u[IU]/mL (ref 0.35–4.50)

## 2021-01-26 LAB — VITAMIN B12: Vitamin B-12: 339 pg/mL (ref 211–911)

## 2021-01-26 LAB — VITAMIN D 25 HYDROXY (VIT D DEFICIENCY, FRACTURES): VITD: 24.15 ng/mL — ABNORMAL LOW (ref 30.00–100.00)

## 2021-01-27 LAB — COMPREHENSIVE METABOLIC PANEL
ALT: 22 U/L (ref 0–35)
AST: 17 U/L (ref 0–37)
Albumin: 4.1 g/dL (ref 3.5–5.2)
Alkaline Phosphatase: 63 U/L (ref 39–117)
BUN: 15 mg/dL (ref 6–23)
CO2: 25 mEq/L (ref 19–32)
Calcium: 9.6 mg/dL (ref 8.4–10.5)
Chloride: 105 mEq/L (ref 96–112)
Creatinine, Ser: 0.84 mg/dL (ref 0.40–1.20)
GFR: 77.72 mL/min (ref >60.00)
Glucose, Bld: 90 mg/dL (ref 70–99)
Potassium: 4.4 mEq/L (ref 3.5–5.1)
Sodium: 140 mEq/L (ref 135–145)
Total Bilirubin: 0.5 mg/dL (ref 0.2–1.2)
Total Protein: 7.2 g/dL (ref 6.0–8.3)

## 2021-01-27 LAB — LIPID PANEL
Cholesterol: 209 mg/dL — ABNORMAL HIGH (ref 0–200)
HDL: 34.8 mg/dL — ABNORMAL LOW (ref >39.00)
LDL Cholesterol: 144 mg/dL — ABNORMAL HIGH (ref 0–99)
NonHDL: 174.5
Total CHOL/HDL Ratio: 6
Triglycerides: 151 mg/dL — ABNORMAL HIGH (ref 0.0–149.0)
VLDL: 30.2 mg/dL (ref 0.0–40.0)

## 2021-01-28 ENCOUNTER — Other Ambulatory Visit: Payer: Self-pay | Admitting: Internal Medicine

## 2021-01-28 ENCOUNTER — Telehealth: Payer: Self-pay | Admitting: Internal Medicine

## 2021-01-28 ENCOUNTER — Encounter: Payer: Self-pay | Admitting: Internal Medicine

## 2021-01-28 DIAGNOSIS — E782 Mixed hyperlipidemia: Secondary | ICD-10-CM

## 2021-01-28 DIAGNOSIS — R7302 Impaired glucose tolerance (oral): Secondary | ICD-10-CM | POA: Insufficient documentation

## 2021-01-28 DIAGNOSIS — E559 Vitamin D deficiency, unspecified: Secondary | ICD-10-CM

## 2021-01-28 MED ORDER — VITAMIN D (ERGOCALCIFEROL) 1.25 MG (50000 UNIT) PO CAPS
50000.0000 [IU] | ORAL_CAPSULE | ORAL | 0 refills | Status: AC
  Filled 2021-03-02: qty 8, 56d supply, fill #0

## 2021-01-28 MED ORDER — ATORVASTATIN CALCIUM 20 MG PO TABS
20.0000 mg | ORAL_TABLET | Freq: Every day | ORAL | 1 refills | Status: DC
  Filled 2021-03-02: qty 90, 90d supply, fill #0
  Filled 2021-06-05: qty 90, 90d supply, fill #1

## 2021-01-28 NOTE — Telephone Encounter (Signed)
Patient is calling and wanted to see if someone could go over her lab results with her, please advise. CB is [phone]

## 2021-01-28 NOTE — Telephone Encounter (Signed)
See result note.  

## 2021-03-02 ENCOUNTER — Other Ambulatory Visit (HOSPITAL_COMMUNITY): Payer: Self-pay

## 2021-03-03 ENCOUNTER — Other Ambulatory Visit (HOSPITAL_COMMUNITY): Payer: Self-pay

## 2021-04-30 ENCOUNTER — Other Ambulatory Visit: Payer: Self-pay

## 2021-04-30 ENCOUNTER — Ambulatory Visit (INDEPENDENT_AMBULATORY_CARE_PROVIDER_SITE_OTHER): Payer: No Typology Code available for payment source | Admitting: Internal Medicine

## 2021-04-30 ENCOUNTER — Encounter: Payer: Self-pay | Admitting: Internal Medicine

## 2021-04-30 VITALS — BP 120/80 | HR 68 | Temp 98.2°F | Wt 165.4 lb

## 2021-04-30 DIAGNOSIS — R7302 Impaired glucose tolerance (oral): Secondary | ICD-10-CM | POA: Diagnosis not present

## 2021-04-30 DIAGNOSIS — E782 Mixed hyperlipidemia: Secondary | ICD-10-CM

## 2021-04-30 DIAGNOSIS — Z23 Encounter for immunization: Secondary | ICD-10-CM | POA: Diagnosis not present

## 2021-04-30 DIAGNOSIS — R7303 Prediabetes: Secondary | ICD-10-CM

## 2021-04-30 DIAGNOSIS — E559 Vitamin D deficiency, unspecified: Secondary | ICD-10-CM

## 2021-04-30 LAB — LIPID PANEL
Cholesterol: 147 mg/dL (ref 0–200)
HDL: 32.9 mg/dL — ABNORMAL LOW (ref >39.00)
LDL Cholesterol: 85 mg/dL (ref 0–99)
NonHDL: 113.6
Total CHOL/HDL Ratio: 4
Triglycerides: 145 mg/dL (ref 0.0–149.0)
VLDL: 29 mg/dL (ref 0.0–40.0)

## 2021-04-30 LAB — POCT GLYCOSYLATED HEMOGLOBIN (HGB A1C): Hemoglobin A1C: 5.7 % — AB (ref 4.0–5.6)

## 2021-04-30 LAB — VITAMIN D 25 HYDROXY (VIT D DEFICIENCY, FRACTURES): VITD: 36.83 ng/mL (ref 30.00–100.00)

## 2021-04-30 NOTE — Progress Notes (Signed)
Established Patient Office Visit     This visit occurred during the SARS-CoV-2 public health emergency.  Safety protocols were in place, including screening questions prior to the visit, additional usage of staff PPE, and extensive cleaning of exam room while observing appropriate contact time as indicated for disinfecting solutions.    CC/Reason for Visit: Follow-up chronic medical conditions  HPI: Danielle Pace is a 55 y.o. female who is coming in today for the above mentioned reasons. Past Medical History is significant for: Impaired glucose tolerance, hyperlipidemia, remote mammogram.  At last visit she was started on 20 mg of atorvastatin due to elevated LDL.  She was also placed for vitamin D supplementation.  She is here to have those levels checked today.  She is requesting her second shingles vaccine, declines flu vaccination today.   Past Medical/Surgical History: Past Medical History:  Diagnosis Date   Arthritis    Complication of anesthesia    GERD (gastroesophageal reflux disease)    Irritable bowel syndrome    occationally   Meniscus tear    right   PONV (postoperative nausea and vomiting)    RA (rheumatoid arthritis) ([facility])     Past Surgical History:  Procedure Laterality Date   FOOT SURGERY     JOINT REPLACEMENT     OVARY SURGERY     REDUCTION MAMMAPLASTY  2017   TOTAL KNEE ARTHROPLASTY Right 02/15/2020   Procedure: TOTAL KNEE ARTHROPLASTY;  Surgeon: Jodi Geralds, MD;  Location: [facility];  Service: Orthopedics;  Laterality: Right;    Social History:  reports that she has never smoked. She has never used smokeless tobacco. She reports that she does not drink alcohol and does not use drugs.  Allergies: Allergies  Allergen Reactions   Omnipaque [Iohexol] Hives and Itching   Other Other (See Comments)    NO BLOOD PRODUCTS     Family History:  Family History  Problem Relation Age of Onset   Breast cancer Mother    Diabetes Mother     Hypertension Mother    Kidney disease Sister    Colon polyps Sister        x 2 sisters     Current Outpatient Medications:    acetaminophen (TYLENOL) 650 MG CR tablet, Take 650-1,300 mg by mouth every 8 (eight) hours as needed for pain., Disp: , Rfl:    aspirin EC 325 MG tablet, Take 1 tablet (325 mg total) by mouth 2 (two) times daily after a meal. Take x 1 month post op to decrease risk of blood clots., Disp: 60 tablet, Rfl: 0   aspirin-acetaminophen-caffeine (EXCEDRIN MIGRAINE) 250-250-65 MG tablet, Take 1 tablet by mouth every 6 (six) hours as needed for headache., Disp: , Rfl:    aspirin-sod bicarb-citric acid (ALKA-SELTZER) 325 MG TBEF tablet, Take 325 mg by mouth every 6 (six) hours as needed (upset stomach)., Disp: , Rfl:    atorvastatin (LIPITOR) 20 MG tablet, Take 1 tablet (20 mg total) by mouth daily., Disp: 90 tablet, Rfl: 1   cetirizine (ZYRTEC) 10 MG tablet, Take 1 tablet (10 mg total) by mouth daily., Disp: 30 tablet, Rfl: 0   docusate sodium (COLACE) 100 MG capsule, Take 1 capsule (100 mg total) by mouth 2 (two) times daily., Disp: 30 capsule, Rfl: 0   fluticasone (FLONASE) 50 MCG/ACT nasal spray, Place 1-2 sprays into both nostrils daily as needed for allergies or rhinitis., Disp: , Rfl:    Melatonin 2.5 MG CHEW, Chew 5 mg by mouth  at bedtime as needed (sleep.)., Disp: , Rfl:    Multiple Vitamin (MULITIVITAMIN WITH MINERALS) TABS, Take 1 tablet by mouth daily. Alive Women's Multivitamin 50+, Disp: , Rfl:    Naphazoline-Pheniramine (OPCON-A) 0.027-0.315 % SOLN, Place 1-2 drops into both eyes 3 (three) times daily as needed (dry/irritated eyes.)., Disp: , Rfl:    Phenyleph-Doxylamine-DM-APAP (ALKA SELTZER PLUS PO), Take 2 tablets by mouth every 4 (four) hours as needed (cold symptoms.). , Disp: , Rfl:    Senna (SENOKOT LAXATIVE GUMMIES PO), Take 1 tablet by mouth daily as needed (constipation.)., Disp: , Rfl:    vitamin C (ASCORBIC ACID) 500 MG tablet, Take 500 mg by mouth 2 (two)  times a week. , Disp: , Rfl:    Vitamin D, Ergocalciferol, (DRISDOL) 1.25 MG (50000 UNIT) CAPS capsule, Take 1 capsule (50,000 Units total) by mouth every 7 (seven) days for a total of 12 doses., Disp: 8 capsule, Rfl: 0  Review of Systems:  Constitutional: Denies fever, chills, diaphoresis, appetite change and fatigue.  HEENT: Denies photophobia, eye pain, redness, hearing loss, ear pain, congestion, sore throat, rhinorrhea, sneezing, mouth sores, trouble swallowing, neck pain, neck stiffness and tinnitus.   Respiratory: Denies SOB, DOE, cough, chest tightness,  and wheezing.   Cardiovascular: Denies chest pain, palpitations and leg swelling.  Gastrointestinal: Denies nausea, vomiting, abdominal pain, diarrhea, constipation, blood in stool and abdominal distention.  Genitourinary: Denies dysuria, urgency, frequency, hematuria, flank pain and difficulty urinating.  Endocrine: Denies: hot or cold intolerance, sweats, changes in hair or nails, polyuria, polydipsia. Musculoskeletal: Denies myalgias, back pain, joint swelling, arthralgias and gait problem.  Skin: Denies pallor, rash and wound.  Neurological: Denies dizziness, seizures, syncope, weakness, light-headedness, numbness and headaches.  Hematological: Denies adenopathy. Easy bruising, personal or family bleeding history  Psychiatric/Behavioral: Denies suicidal ideation, mood changes, confusion, nervousness, sleep disturbance and agitation    Physical Exam: Vitals:   04/30/21 1020  BP: 120/80  Pulse: 68  Temp: 98.2 F (36.8 C)  TempSrc: Oral  SpO2: 99%  Weight: 165 lb 6.4 oz (75 kg)    Body mass index is 32.85 kg/m.   Constitutional: NAD, calm, comfortable Eyes: PERRL, lids and conjunctivae normal ENMT: Mucous membranes are moist.  Respiratory: clear to auscultation bilaterally, no wheezing, no crackles. Normal respiratory effort. No accessory muscle use.  Cardiovascular: Regular rate and rhythm, no murmurs / rubs /  gallops. No extremity edema.  Neurologic: Grossly intact and nonfocal Psychiatric: Normal judgment and insight. Alert and oriented x 3. Normal mood.    Impression and Plan:   IGT (impaired glucose tolerance) -A1c today is slightly improved is 5.7.  Vitamin D deficiency  - Plan: VITAMIN D 25 Hydroxy (Vit-D Deficiency, Fractures) -Just completed 12 weeks of high-dose vitamin D supplementation.  Mixed hyperlipidemia  - Plan: Lipid panel -Lipids in July 2022 with a total cholesterol of 209, triglycerides 151 and LDL 144. -She is currently on atorvastatin 20 mg daily.  Need for shingles vaccine -Second shingles vaccine today.  Time spent: 32 minutes reviewing chart, interviewing and examining patient and formulating plan of care.   Patient Instructions  -Nice seeing you today!!  -Lab work today; will notify you once results are available.  -Second shingles vaccine today.  -Schedule follow up in 6 months.    Chaya Jan, MD [facility]

## 2021-04-30 NOTE — Addendum Note (Signed)
Addended by: Kern Reap B on: 04/30/2021 12:08 PM   Modules accepted: Orders

## 2021-04-30 NOTE — Patient Instructions (Signed)
-  Nice seeing you today!!  -Lab work today; will notify you once results are available.  -Second shingles vaccine today.  -Schedule follow up in 6 months. 

## 2021-05-01 ENCOUNTER — Telehealth: Payer: Self-pay

## 2021-05-01 NOTE — Telephone Encounter (Signed)
Patient called asking if she should continue taking Cholesterol medications please advise.

## 2021-05-01 NOTE — Telephone Encounter (Signed)
Advised patient to continue medications as prescribed

## 2021-05-14 ENCOUNTER — Other Ambulatory Visit (HOSPITAL_COMMUNITY): Payer: Self-pay

## 2021-05-14 MED ORDER — IBUPROFEN 800 MG PO TABS
800.0000 mg | ORAL_TABLET | Freq: Three times a day (TID) | ORAL | 1 refills | Status: DC
  Filled 2021-05-14: qty 30, 10d supply, fill #0
  Filled 2022-03-23: qty 30, 10d supply, fill #1

## 2021-05-14 MED ORDER — AMOXICILLIN 500 MG PO CAPS
ORAL_CAPSULE | ORAL | 0 refills | Status: DC
  Filled 2021-05-14: qty 31, 10d supply, fill #0

## 2021-05-15 ENCOUNTER — Other Ambulatory Visit (HOSPITAL_COMMUNITY): Payer: Self-pay

## 2021-06-01 ENCOUNTER — Ambulatory Visit: Payer: No Typology Code available for payment source

## 2021-06-03 ENCOUNTER — Ambulatory Visit (INDEPENDENT_AMBULATORY_CARE_PROVIDER_SITE_OTHER): Payer: No Typology Code available for payment source

## 2021-06-03 ENCOUNTER — Other Ambulatory Visit: Payer: Self-pay

## 2021-06-03 DIAGNOSIS — Z111 Encounter for screening for respiratory tuberculosis: Secondary | ICD-10-CM

## 2021-06-05 ENCOUNTER — Other Ambulatory Visit: Payer: Self-pay | Admitting: Internal Medicine

## 2021-06-05 ENCOUNTER — Other Ambulatory Visit (HOSPITAL_COMMUNITY): Payer: Self-pay

## 2021-06-05 DIAGNOSIS — E782 Mixed hyperlipidemia: Secondary | ICD-10-CM

## 2021-06-05 LAB — TB SKIN TEST
Induration: NEGATIVE mm
TB Skin Test: NEGATIVE

## 2021-06-05 MED ORDER — ATORVASTATIN CALCIUM 20 MG PO TABS
20.0000 mg | ORAL_TABLET | Freq: Every day | ORAL | 1 refills | Status: DC
  Filled 2021-06-05: qty 90, 90d supply, fill #0
  Filled 2021-09-29: qty 90, 90d supply, fill #1

## 2021-06-05 NOTE — Progress Notes (Signed)
PPD Reading Note  PPD read and results entered in EpicCare.  Result: 0 mm induration.  Interpretation: Negative  If test not read within 48-72 hours of initial placement, patient advised to repeat in other arm 1-3 weeks after this test.  Allergic reaction: no

## 2021-06-09 ENCOUNTER — Other Ambulatory Visit (HOSPITAL_COMMUNITY): Payer: Self-pay

## 2021-06-18 ENCOUNTER — Other Ambulatory Visit: Payer: Self-pay | Admitting: Internal Medicine

## 2021-06-18 DIAGNOSIS — Z1231 Encounter for screening mammogram for malignant neoplasm of breast: Secondary | ICD-10-CM

## 2021-07-22 ENCOUNTER — Ambulatory Visit: Payer: No Typology Code available for payment source

## 2021-07-24 ENCOUNTER — Ambulatory Visit: Payer: No Typology Code available for payment source

## 2021-09-29 ENCOUNTER — Other Ambulatory Visit (HOSPITAL_COMMUNITY): Payer: Self-pay

## 2021-10-05 ENCOUNTER — Ambulatory Visit: Payer: No Typology Code available for payment source

## 2021-10-08 ENCOUNTER — Ambulatory Visit
Admission: RE | Admit: 2021-10-08 | Discharge: 2021-10-08 | Disposition: A | Discharge status: Home / Self Care | Payer: No Typology Code available for payment source | Source: Ambulatory Visit | Attending: Internal Medicine | Admitting: Internal Medicine

## 2021-10-08 DIAGNOSIS — Z1231 Encounter for screening mammogram for malignant neoplasm of breast: Secondary | ICD-10-CM

## 2021-10-19 ENCOUNTER — Ambulatory Visit (INDEPENDENT_AMBULATORY_CARE_PROVIDER_SITE_OTHER): Payer: No Typology Code available for payment source

## 2021-10-19 ENCOUNTER — Other Ambulatory Visit: Payer: Self-pay

## 2021-10-19 ENCOUNTER — Ambulatory Visit (INDEPENDENT_AMBULATORY_CARE_PROVIDER_SITE_OTHER): Payer: No Typology Code available for payment source | Admitting: Podiatry

## 2021-10-19 DIAGNOSIS — M7751 Other enthesopathy of right foot: Secondary | ICD-10-CM

## 2021-10-19 DIAGNOSIS — M7752 Other enthesopathy of left foot: Secondary | ICD-10-CM

## 2021-10-19 DIAGNOSIS — M79671 Pain in right foot: Secondary | ICD-10-CM

## 2021-10-19 DIAGNOSIS — M778 Other enthesopathies, not elsewhere classified: Secondary | ICD-10-CM | POA: Diagnosis not present

## 2021-10-19 DIAGNOSIS — M79672 Pain in left foot: Secondary | ICD-10-CM | POA: Diagnosis not present

## 2021-10-19 NOTE — Progress Notes (Signed)
° °  Subjective:  56 y.o. female presenting today for follow-up evaluation of bilateral ankle pain.  Patient states that she works at [employer] long on her feet all day and experiences significant pain and tenderness.  In 2021 custom molded orthotics were recommended but she did not have the insurance at that time to cover the inserts.  Patient would like to proceed with the orthotics now.  She presents for further treatment and evaluation.   Past Medical History:  Diagnosis Date   Arthritis    Complication of anesthesia    GERD (gastroesophageal reflux disease)    Irritable bowel syndrome    occationally   Meniscus tear    right   PONV (postoperative nausea and vomiting)    RA (rheumatoid arthritis) ([facility])      Objective / Physical Exam:  General:  The patient is alert and oriented x3 in no acute distress. Dermatology:  Skin is warm, dry and supple bilateral lower extremities. Negative for open lesions or macerations. Vascular:  Palpable pedal pulses bilaterally. No edema or erythema noted. Capillary refill within normal limits. Neurological:  Epicritic and protective threshold grossly intact bilaterally.  Musculoskeletal Exam:  Pain on palpation to the anterior lateral medial aspects of the patient's bilateral ankles. Mild edema noted.   Range of motion within normal limits to all pedal and ankle joints bilateral. Muscle strength 5/5 in all groups bilateral.  Also noted is an elevated first ray.  Forefoot varus noted.  Reducible.  Radiographic Exam:  Normal osseous mineralization. Joint spaces preserved. No fracture/dislocation/boney destruction.  Elevatus of the first ray noted on lateral view.  Assessment: 1.  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  Synovitis/capsulitis bilateral ankles  Plan of Care:  1. Patient was evaluated. X-Rays reviewed.  2. Injection of 0.5 mL Celestone Soluspan injected in the bilateral ankle joints  3.  Appointment with Pedorthist  for new custom molded orthotics 4.  Return to clinic as needed  Felecia Shelling, DPM [facility]  Dr. Felecia Shelling, DPM    [address] [place], [place] [address]                Office [phone]  Fax [phone]

## 2021-10-20 ENCOUNTER — Other Ambulatory Visit: Payer: Self-pay | Admitting: Podiatry

## 2021-10-20 DIAGNOSIS — M778 Other enthesopathies, not elsewhere classified: Secondary | ICD-10-CM

## 2021-10-26 ENCOUNTER — Other Ambulatory Visit: Payer: Self-pay

## 2021-10-26 ENCOUNTER — Ambulatory Visit: Payer: No Typology Code available for payment source

## 2021-10-26 DIAGNOSIS — M722 Plantar fascial fibromatosis: Secondary | ICD-10-CM

## 2021-10-26 DIAGNOSIS — M21172 Varus deformity, not elsewhere classified, left ankle: Secondary | ICD-10-CM

## 2021-10-26 NOTE — Progress Notes (Signed)
Patient was not sure if she was to be seen for new foot orthotics or an [place] but was unsure regarding financial responsibility about either item. Pre-certs will be run for both sets of codes and Dr Amalia Hailey will be asked for prescription clarification. Patient is to be rescheduled when pre-certification and out of pocket estimation obtained. ? ? ?PRE-CERT #1 - Foot Orthotics - [id number] ?PRE-CERT #2 [facility] [phone], [phone] LT ?

## 2021-11-02 ENCOUNTER — Other Ambulatory Visit (HOSPITAL_COMMUNITY): Payer: Self-pay

## 2021-11-02 ENCOUNTER — Ambulatory Visit (INDEPENDENT_AMBULATORY_CARE_PROVIDER_SITE_OTHER): Payer: No Typology Code available for payment source | Admitting: Internal Medicine

## 2021-11-02 ENCOUNTER — Encounter: Payer: Self-pay | Admitting: Internal Medicine

## 2021-11-02 VITALS — BP 120/80 | HR 64 | Temp 97.8°F | Wt 168.0 lb

## 2021-11-02 DIAGNOSIS — E782 Mixed hyperlipidemia: Secondary | ICD-10-CM

## 2021-11-02 DIAGNOSIS — R7302 Impaired glucose tolerance (oral): Secondary | ICD-10-CM | POA: Diagnosis not present

## 2021-11-02 DIAGNOSIS — J309 Allergic rhinitis, unspecified: Secondary | ICD-10-CM | POA: Diagnosis not present

## 2021-11-02 DIAGNOSIS — E559 Vitamin D deficiency, unspecified: Secondary | ICD-10-CM | POA: Diagnosis not present

## 2021-11-02 LAB — POCT GLYCOSYLATED HEMOGLOBIN (HGB A1C): Hemoglobin A1C: 6.1 % — AB (ref 4.0–5.6)

## 2021-11-02 MED ORDER — ATORVASTATIN CALCIUM 20 MG PO TABS
20.0000 mg | ORAL_TABLET | Freq: Every day | ORAL | 1 refills | Status: DC
  Filled 2021-11-02 – 2021-12-18 (×3): qty 90, 90d supply, fill #0
  Filled 2022-05-04: qty 90, 90d supply, fill #1

## 2021-11-02 MED ORDER — FLUTICASONE PROPIONATE 50 MCG/ACT NA SUSP
1.0000 | Freq: Every day | NASAL | 2 refills | Status: DC | PRN
  Filled 2021-11-02: qty 16, 30d supply, fill #0
  Filled 2022-03-23: qty 16, 30d supply, fill #1

## 2021-11-02 NOTE — Progress Notes (Signed)
? ? ? ?Established Patient Office Visit ? ? ? ? ?This visit occurred during the SARS-CoV-2 public health emergency.  Safety protocols were in place, including screening questions prior to the visit, additional usage of staff PPE, and extensive cleaning of exam room while observing appropriate contact time as indicated for disinfecting solutions.  ? ? ?CC/Reason for Visit: 6397-month follow-up chronic medical conditions ? ?HPI: [name] is a 56 y.o. female who is coming in today for the above mentioned reasons. Past Medical History is significant for: Hyperlipidemia, impaired glucose tolerance and vitamin D deficiency.  She is feeling well.  She has been dealing with painful feet.  She was born with a club left foot.  She is being followed by podiatry.  She recently had bilateral feet steroid injections.  They are looking at custom inserts for her.  She states she has had her COVID booster, she will forward records to us. ? ? ?Past Medical/Surgical History: ?Past Medical History:  ?Diagnosis Date  ? Arthritis   ? Complication of anesthesia   ? GERD (gastroesophageal reflux disease)   ? Irritable bowel syndrome   ? occationally  ? Meniscus tear   ? right  ? PONV (postoperative nausea and vomiting)   ? RA (rheumatoid arthritis) (HCC)   ? ? ?Past Surgical History:  ?Procedure Laterality Date  ? FOOT SURGERY    ? JOINT REPLACEMENT    ? OVARY SURGERY    ? REDUCTION MAMMAPLASTY  2017  ? TOTAL KNEE ARTHROPLASTY Right 02/15/2020  ? Procedure: TOTAL KNEE ARTHROPLASTY;  Surgeon: Jodi GeraldsGraves, John, MD;  Location: [facility];  Service: Orthopedics;  Laterality: Right;  ? ? ?Social History: ? reports that she has never smoked. She has never used smokeless tobacco. She reports that she does not drink alcohol and does not use drugs. ? ?Allergies: ?Allergies  ?Allergen Reactions  ? Omnipaque [Iohexol] Hives and Itching  ? Other Other (See Comments)  ?  NO BLOOD PRODUCTS ?  ? ? ?Family History:  ?Family History  ?Problem Relation Age of  Onset  ? Breast cancer Mother   ? Diabetes Mother   ? Hypertension Mother   ? Kidney disease Sister   ? Colon polyps Sister   ?     x 2 sisters  ? ? ? ?Current Outpatient Medications:  ?  acetaminophen (TYLENOL) 650 MG CR tablet, Take 650-1,300 mg by mouth every 8 (eight) hours as needed for pain., Disp: , Rfl:  ?  aspirin EC 325 MG tablet, Take 1 tablet (325 mg total) by mouth 2 (two) times daily after a meal. Take x 1 month post op to decrease risk of blood clots., Disp: 60 tablet, Rfl: 0 ?  aspirin-acetaminophen-caffeine (EXCEDRIN MIGRAINE) 250-250-65 MG tablet, Take 1 tablet by mouth every 6 (six) hours as needed for headache., Disp: , Rfl:  ?  aspirin-sod bicarb-citric acid (ALKA-SELTZER) 325 MG TBEF tablet, Take 325 mg by mouth every 6 (six) hours as needed (upset stomach)., Disp: , Rfl:  ?  cetirizine (ZYRTEC) 10 MG tablet, Take 1 tablet (10 mg total) by mouth daily., Disp: 30 tablet, Rfl: 0 ?  docusate sodium (COLACE) 100 MG capsule, Take 1 capsule (100 mg total) by mouth 2 (two) times daily., Disp: 30 capsule, Rfl: 0 ?  ibuprofen (ADVIL) 800 MG tablet, Take 1 tablet (800 mg total) by mouth every 8 (eight) hours., Disp: 30 tablet, Rfl: 1 ?  Melatonin 2.5 MG CHEW, Chew 5 mg by mouth at bedtime as needed (sleep.).,  Disp: , Rfl:  ?  Multiple Vitamin (MULITIVITAMIN WITH MINERALS) TABS, Take 1 tablet by mouth daily. Alive Women's Multivitamin 50+, Disp: , Rfl:  ?  Naphazoline-Pheniramine (OPCON-A) 0.027-0.315 % SOLN, Place 1-2 drops into both eyes 3 (three) times daily as needed (dry/irritated eyes.)., Disp: , Rfl:  ?  Phenyleph-Doxylamine-DM-APAP (ALKA SELTZER PLUS PO), Take 2 tablets by mouth every 4 (four) hours as needed (cold symptoms.). , Disp: , Rfl:  ?  Senna (SENOKOT LAXATIVE GUMMIES PO), Take 1 tablet by mouth daily as needed (constipation.)., Disp: , Rfl:  ?  vitamin C (ASCORBIC ACID) 500 MG tablet, Take 500 mg by mouth 2 (two) times a week. , Disp: , Rfl:  ?  Vitamin D, Ergocalciferol, (DRISDOL) 1.25  MG (50000 UNIT) CAPS capsule, Take 1 capsule (50,000 Units total) by mouth every 7 (seven) days for a total of 12 doses., Disp: 8 capsule, Rfl: 0 ?  atorvastatin (LIPITOR) 20 MG tablet, Take 1 tablet (20 mg total) by mouth daily., Disp: 90 tablet, Rfl: 1 ?  fluticasone (FLONASE) 50 MCG/ACT nasal spray, Place 1-2 sprays into both nostrils daily as needed for allergies or rhinitis., Disp: 16 g, Rfl: 2 ? ?Review of Systems:  ?Constitutional: Denies fever, chills, diaphoresis, appetite change and fatigue.  ?HEENT: Denies photophobia, eye pain, redness, hearing loss, ear pain, congestion, sore throat, rhinorrhea, sneezing, mouth sores, trouble swallowing, neck pain, neck stiffness and tinnitus.   ?Respiratory: Denies SOB, DOE, cough, chest tightness,  and wheezing.   ?Cardiovascular: Denies chest pain, palpitations and leg swelling.  ?Gastrointestinal: Denies nausea, vomiting, abdominal pain, diarrhea, constipation, blood in stool and abdominal distention.  ?Genitourinary: Denies dysuria, urgency, frequency, hematuria, flank pain and difficulty urinating.  ?Endocrine: Denies: hot or cold intolerance, sweats, changes in hair or nails, polyuria, polydipsia. ?Musculoskeletal: Denies myalgias, back pain, joint swelling, arthralgias and gait problem.  ?Skin: Denies pallor, rash and wound.  ?Neurological: Denies dizziness, seizures, syncope, weakness, light-headedness, numbness and headaches.  ?Hematological: Denies adenopathy. Easy bruising, personal or family bleeding history  ?Psychiatric/Behavioral: Denies suicidal ideation, mood changes, confusion, nervousness, sleep disturbance and agitation ? ? ? ?Physical Exam: ?Vitals:  ? 11/02/21 0838  ?BP: 120/80  ?Pulse: 64  ?Temp: 97.8 ?F (36.6 ?C)  ?TempSrc: Oral  ?SpO2: 99%  ?Weight: 168 lb (76.2 kg)  ? ? ?Body mass index is 33.36 kg/m?. ? ? ?Constitutional: NAD, calm, comfortable ?Eyes: PERRL, lids and conjunctivae normal ?ENMT: Mucous membranes are moist. ?Respiratory: clear  to auscultation bilaterally, no wheezing, no crackles. Normal respiratory effort. No accessory muscle use.  ?Cardiovascular: Regular rate and rhythm, no murmurs / rubs / gallops. No extremity edema.  ?Neurologic: Grossly intact and nonfocal ?Psychiatric: Normal judgment and insight. Alert and oriented x 3. Normal mood.  ? ? ?Impression and Plan: ? ?IGT (impaired glucose tolerance)  ?- Plan: POCT glycosylated hemoglobin (Hb A1C) ?-A1c has increased to 6.1, continue to follow.  Lifestyle changes have been encouraged. ? ?Mixed hyperlipidemia  ?- Plan: atorvastatin (LIPITOR) 20 MG tablet ?-Recheck lipids when she returns for CPE ? ?Vitamin D deficiency ?-Recheck levels when she returns for CPE ? ?Allergic rhinitis, unspecified seasonality, unspecified trigger  ?- Plan: fluticasone (FLONASE) 50 MCG/ACT nasal spray ? ?Time spent: 30 minutes reviewing chart, interviewing and examining patient and formulating plan of care. ? ? ? ?Chaya Jan, MD ?[facility] ? ? ?

## 2021-11-05 ENCOUNTER — Other Ambulatory Visit (HOSPITAL_COMMUNITY): Payer: Self-pay

## 2021-11-19 ENCOUNTER — Telehealth: Payer: Self-pay | Admitting: Internal Medicine

## 2021-11-19 DIAGNOSIS — M069 Rheumatoid arthritis, unspecified: Secondary | ICD-10-CM

## 2021-11-19 NOTE — Telephone Encounter (Signed)
Referral placed.

## 2021-11-19 NOTE — Telephone Encounter (Signed)
Pt call and stated she need a new referral ,office notes and labs work  sent to dr Jenetta Loges ?

## 2021-11-25 ENCOUNTER — Telehealth: Payer: Self-pay | Admitting: Internal Medicine

## 2021-11-25 NOTE — Telephone Encounter (Signed)
Pt call and stated she want to know will dr .Ardyth Harps  sent her a RX for a pain pill that want make her sleepy in the day time to  ?[facility] Phone:  [phone]  ?Fax:  [phone]  ?  ? ?

## 2021-11-25 NOTE — Telephone Encounter (Signed)
Last Ov 11/02/21  ?

## 2021-12-03 NOTE — Telephone Encounter (Signed)
MyChart message sent to patient.

## 2021-12-09 ENCOUNTER — Other Ambulatory Visit (HOSPITAL_COMMUNITY): Payer: Self-pay

## 2021-12-10 ENCOUNTER — Other Ambulatory Visit (HOSPITAL_COMMUNITY): Payer: Self-pay

## 2021-12-15 ENCOUNTER — Ambulatory Visit: Payer: No Typology Code available for payment source | Admitting: Internal Medicine

## 2021-12-17 ENCOUNTER — Ambulatory Visit (INDEPENDENT_AMBULATORY_CARE_PROVIDER_SITE_OTHER): Payer: No Typology Code available for payment source | Admitting: Internal Medicine

## 2021-12-17 ENCOUNTER — Encounter: Payer: Self-pay | Admitting: Internal Medicine

## 2021-12-17 VITALS — BP 120/78 | HR 80 | Temp 98.2°F | Wt 166.9 lb

## 2021-12-17 DIAGNOSIS — M069 Rheumatoid arthritis, unspecified: Secondary | ICD-10-CM

## 2021-12-17 DIAGNOSIS — M79672 Pain in left foot: Secondary | ICD-10-CM | POA: Diagnosis not present

## 2021-12-17 DIAGNOSIS — M79671 Pain in right foot: Secondary | ICD-10-CM | POA: Diagnosis not present

## 2021-12-17 NOTE — Progress Notes (Signed)
? ? ? ?Established Patient Office Visit ? ? ? ? ?This visit occurred during the SARS-CoV-2 public health emergency.  Safety protocols were in place, including screening questions prior to the visit, additional usage of staff PPE, and extensive cleaning of exam room while observing appropriate contact time as indicated for disinfecting solutions.  ? ? ?CC/Reason for Visit: Bilateral foot pain ? ?HPI: Danielle Pace is a 56 y.o. female who is coming in today for the above mentioned reasons.  She has a history of rheumatoid arthritis but has never been on treatment.  She has an appointment to see Dr. Amil Amen, rheumatology, as a new patient but that is not until August.  She is having a lot of foot pain.  She has seen podiatrist.  She has an appointment scheduled to see Dr. Doran Durand coming up soon.  She tells me that they are working on getting her orthotics and she has had several steroid injections of her foot.  She works 2 jobs, 1 at [employer] and another one at [employer].  After being on her feet all day it is very painful. ? ?Past Medical/Surgical History: ?Past Medical History:  ?Diagnosis Date  ? Arthritis   ? Complication of anesthesia   ? GERD (gastroesophageal reflux disease)   ? Irritable bowel syndrome   ? occationally  ? Meniscus tear   ? right  ? PONV (postoperative nausea and vomiting)   ? RA (rheumatoid arthritis) ([place])   ? ? ?Past Surgical History:  ?Procedure Laterality Date  ? FOOT SURGERY    ? JOINT REPLACEMENT    ? OVARY SURGERY    ? REDUCTION MAMMAPLASTY  2017  ? TOTAL KNEE ARTHROPLASTY Right 02/15/2020  ? Procedure: TOTAL KNEE ARTHROPLASTY;  Surgeon: Dorna Leitz, MD;  Location: [facility];  Service: Orthopedics;  Laterality: Right;  ? ? ?Social History: ? reports that she has never smoked. She has never used smokeless tobacco. She reports that she does not drink alcohol and does not use drugs. ? ?Allergies: ?Allergies  ?Allergen Reactions  ? Omnipaque [Iohexol] Hives and Itching  ?  Other Other (See Comments)  ?  NO BLOOD PRODUCTS ?  ? ? ?Family History:  ?Family History  ?Problem Relation Age of Onset  ? Breast cancer Mother   ? Diabetes Mother   ? Hypertension Mother   ? Kidney disease Sister   ? Colon polyps Sister   ?     x 2 sisters  ? ? ? ?Current Outpatient Medications:  ?  acetaminophen (TYLENOL) 650 MG CR tablet, Take 650-1,300 mg by mouth every 8 (eight) hours as needed for pain., Disp: , Rfl:  ?  aspirin EC 325 MG tablet, Take 1 tablet (325 mg total) by mouth 2 (two) times daily after a meal. Take x 1 month post op to decrease risk of blood clots., Disp: 60 tablet, Rfl: 0 ?  aspirin-acetaminophen-caffeine (EXCEDRIN MIGRAINE) 250-250-65 MG tablet, Take 1 tablet by mouth every 6 (six) hours as needed for headache., Disp: , Rfl:  ?  aspirin-sod bicarb-citric acid (ALKA-SELTZER) 325 MG TBEF tablet, Take 325 mg by mouth every 6 (six) hours as needed (upset stomach)., Disp: , Rfl:  ?  atorvastatin (LIPITOR) 20 MG tablet, Take 1 tablet (20 mg total) by mouth daily., Disp: 90 tablet, Rfl: 1 ?  cetirizine (ZYRTEC) 10 MG tablet, Take 1 tablet (10 mg total) by mouth daily., Disp: 30 tablet, Rfl: 0 ?  docusate sodium (COLACE) 100 MG capsule, Take 1 capsule (  100 mg total) by mouth 2 (two) times daily., Disp: 30 capsule, Rfl: 0 ?  fluticasone (FLONASE) 50 MCG/ACT nasal spray, Place 1-2 sprays into both nostrils daily as needed for allergies or rhinitis., Disp: 16 g, Rfl: 2 ?  ibuprofen (ADVIL) 800 MG tablet, Take 1 tablet (800 mg total) by mouth every 8 (eight) hours., Disp: 30 tablet, Rfl: 1 ?  Melatonin 2.5 MG CHEW, Chew 5 mg by mouth at bedtime as needed (sleep.)., Disp: , Rfl:  ?  Multiple Vitamin (MULITIVITAMIN WITH MINERALS) TABS, Take 1 tablet by mouth daily. Alive Women's Multivitamin 50+, Disp: , Rfl:  ?  Naphazoline-Pheniramine (OPCON-A) 0.027-0.315 % SOLN, Place 1-2 drops into both eyes 3 (three) times daily as needed (dry/irritated eyes.)., Disp: , Rfl:  ?  Phenyleph-Doxylamine-DM-APAP  (ALKA SELTZER PLUS PO), Take 2 tablets by mouth every 4 (four) hours as needed (cold symptoms.). , Disp: , Rfl:  ?  Senna (SENOKOT LAXATIVE GUMMIES PO), Take 1 tablet by mouth daily as needed (constipation.)., Disp: , Rfl:  ?  vitamin C (ASCORBIC ACID) 500 MG tablet, Take 500 mg by mouth 2 (two) times a week. , Disp: , Rfl:  ?  Vitamin D, Ergocalciferol, (DRISDOL) 1.25 MG (50000 UNIT) CAPS capsule, Take 1 capsule (50,000 Units total) by mouth every 7 (seven) days for a total of 12 doses., Disp: 8 capsule, Rfl: 0 ? ?Review of Systems:  ?Constitutional: Denies fever, chills, diaphoresis, appetite change and fatigue.  ?HEENT: Denies photophobia, eye pain, redness, hearing loss, ear pain, congestion, sore throat, rhinorrhea, sneezing, mouth sores, trouble swallowing, neck pain, neck stiffness and tinnitus.   ?Respiratory: Denies SOB, DOE, cough, chest tightness,  and wheezing.   ?Cardiovascular: Denies chest pain, palpitations and leg swelling.  ?Gastrointestinal: Denies nausea, vomiting, abdominal pain, diarrhea, constipation, blood in stool and abdominal distention.  ?Genitourinary: Denies dysuria, urgency, frequency, hematuria, flank pain and difficulty urinating.  ?Endocrine: Denies: hot or cold intolerance, sweats, changes in hair or nails, polyuria, polydipsia. ?Musculoskeletal: Denies myalgias, back pain, joint swelling. ?Skin: Denies pallor, rash and wound.  ?Neurological: Denies dizziness, seizures, syncope, weakness, light-headedness, numbness and headaches.  ?Hematological: Denies adenopathy. Easy bruising, personal or family bleeding history  ?Psychiatric/Behavioral: Denies suicidal ideation, mood changes, confusion, nervousness, sleep disturbance and agitation ? ? ? ?Physical Exam: ?Vitals:  ? 12/17/21 1338  ?BP: 120/78  ?Pulse: 80  ?Temp: 98.2 ?F (36.8 ?C)  ?TempSrc: Oral  ?SpO2: 99%  ?Weight: 166 lb 14.4 oz (75.7 kg)  ? ? ?Body mass index is 33.15 kg/m?. ? ? ?Constitutional: NAD, calm, comfortable ?Eyes:  PERRL, lids and conjunctivae normal ?ENMT: Mucous membranes are moist.  ?Psychiatric: Normal judgment and insight. Alert and oriented x 3.  Tearful ? ? ?Impression and Plan: ? ?Rheumatoid arthritis involving multiple sites, unspecified whether rheumatoid factor present ([place]) ? ?Pain in both feet ? ?-I believe the appropriate step here is indeed to follow-up with rheumatology.  She also has an upcoming appointment with a foot and ankle specialist orthopedist. ?-In the meantime she will treat pain with ibuprofen alternating with Tylenol. ? ?Time spent:22 minutes reviewing chart, interviewing and examining patient and formulating plan of care. ? ? ? ? ? ?Lelon Frohlich, MD ?[facility] ? ? ?

## 2021-12-18 ENCOUNTER — Other Ambulatory Visit (HOSPITAL_COMMUNITY): Payer: Self-pay

## 2022-01-04 ENCOUNTER — Other Ambulatory Visit (HOSPITAL_COMMUNITY): Payer: Self-pay

## 2022-01-04 MED ORDER — MELOXICAM 7.5 MG PO TABS
7.5000 mg | ORAL_TABLET | Freq: Two times a day (BID) | ORAL | 0 refills | Status: DC
  Filled 2022-01-04: qty 60, 30d supply, fill #0

## 2022-01-27 ENCOUNTER — Encounter: Payer: No Typology Code available for payment source | Admitting: Internal Medicine

## 2022-03-23 ENCOUNTER — Other Ambulatory Visit (HOSPITAL_COMMUNITY): Payer: Self-pay

## 2022-04-15 ENCOUNTER — Ambulatory Visit (INDEPENDENT_AMBULATORY_CARE_PROVIDER_SITE_OTHER): Payer: No Typology Code available for payment source | Admitting: Internal Medicine

## 2022-04-15 ENCOUNTER — Encounter: Payer: Self-pay | Admitting: Internal Medicine

## 2022-04-15 VITALS — BP 124/80 | HR 74 | Temp 98.5°F | Ht 59.0 in | Wt 165.8 lb

## 2022-04-15 DIAGNOSIS — Z23 Encounter for immunization: Secondary | ICD-10-CM

## 2022-04-15 DIAGNOSIS — R7302 Impaired glucose tolerance (oral): Secondary | ICD-10-CM

## 2022-04-15 DIAGNOSIS — Z0000 Encounter for general adult medical examination without abnormal findings: Secondary | ICD-10-CM | POA: Diagnosis not present

## 2022-04-15 DIAGNOSIS — Z1159 Encounter for screening for other viral diseases: Secondary | ICD-10-CM

## 2022-04-15 DIAGNOSIS — E559 Vitamin D deficiency, unspecified: Secondary | ICD-10-CM | POA: Diagnosis not present

## 2022-04-15 DIAGNOSIS — M069 Rheumatoid arthritis, unspecified: Secondary | ICD-10-CM | POA: Diagnosis not present

## 2022-04-15 DIAGNOSIS — Z114 Encounter for screening for human immunodeficiency virus [HIV]: Secondary | ICD-10-CM

## 2022-04-15 DIAGNOSIS — E782 Mixed hyperlipidemia: Secondary | ICD-10-CM | POA: Diagnosis not present

## 2022-04-15 LAB — COMPREHENSIVE METABOLIC PANEL
ALT: 30 U/L (ref 0–35)
AST: 23 U/L (ref 0–37)
Albumin: 4.3 g/dL (ref 3.5–5.2)
Alkaline Phosphatase: 59 U/L (ref 39–117)
BUN: 14 mg/dL (ref 6–23)
CO2: 30 mEq/L (ref 19–32)
Calcium: 9.9 mg/dL (ref 8.4–10.5)
Chloride: 100 mEq/L (ref 96–112)
Creatinine, Ser: 0.85 mg/dL (ref 0.40–1.20)
GFR: 75.97 mL/min (ref >60.00)
Glucose, Bld: 82 mg/dL (ref 70–99)
Potassium: 3.9 mEq/L (ref 3.5–5.1)
Sodium: 139 mEq/L (ref 135–145)
Total Bilirubin: 0.6 mg/dL (ref 0.2–1.2)
Total Protein: 7.7 g/dL (ref 6.0–8.3)

## 2022-04-15 LAB — TSH: TSH: 1.28 u[IU]/mL (ref 0.35–5.50)

## 2022-04-15 LAB — CBC WITH DIFFERENTIAL/PLATELET
Basophils Absolute: 0.1 10*3/uL (ref 0.0–0.1)
Basophils Relative: 0.9 % (ref 0.0–3.0)
Eosinophils Absolute: 0.4 10*3/uL (ref 0.0–0.7)
Eosinophils Relative: 5.1 % — ABNORMAL HIGH (ref 0.0–5.0)
HCT: 39.3 % (ref 36.0–46.0)
Hemoglobin: 13.1 g/dL (ref 12.0–15.0)
Lymphocytes Relative: 20.6 % (ref 12.0–46.0)
Lymphs Abs: 1.7 10*3/uL (ref 0.7–4.0)
MCHC: 33.3 g/dL (ref 30.0–36.0)
MCV: 90.1 fl (ref 78.0–100.0)
Monocytes Absolute: 0.5 10*3/uL (ref 0.1–1.0)
Monocytes Relative: 6.5 % (ref 3.0–12.0)
Neutro Abs: 5.6 10*3/uL (ref 1.4–7.7)
Neutrophils Relative %: 66.9 % (ref 43.0–77.0)
Platelets: 244 10*3/uL (ref 150.0–400.0)
RBC: 4.36 Mil/uL (ref 3.87–5.11)
RDW: 13.8 % (ref 11.5–15.5)
WBC: 8.4 10*3/uL (ref 4.0–10.5)

## 2022-04-15 LAB — VITAMIN D 25 HYDROXY (VIT D DEFICIENCY, FRACTURES): VITD: 52.62 ng/mL (ref 30.00–100.00)

## 2022-04-15 LAB — LIPID PANEL
Cholesterol: 142 mg/dL (ref 0–200)
HDL: 32.9 mg/dL — ABNORMAL LOW (ref >39.00)
LDL Cholesterol: 76 mg/dL (ref 0–99)
NonHDL: 108.62
Total CHOL/HDL Ratio: 4
Triglycerides: 164 mg/dL — ABNORMAL HIGH (ref 0.0–149.0)
VLDL: 32.8 mg/dL (ref 0.0–40.0)

## 2022-04-15 LAB — VITAMIN B12: Vitamin B-12: 399 pg/mL (ref 211–911)

## 2022-04-15 LAB — HEMOGLOBIN A1C: Hgb A1c MFr Bld: 6.1 % (ref 4.6–6.5)

## 2022-04-15 NOTE — Progress Notes (Signed)
Established Patient Office Visit     CC/Reason for Visit: Annual preventive exam  HPI: Danielle Pace is a 56 y.o. female who is coming in today for the above mentioned reasons. Past Medical History is significant for: Rheumatoid arthritis followed by Dr. Dierdre Forth, hyperlipidemia, vitamin D deficiency, impaired glucose tolerance.  She has no acute concerns today.  She has routine eye and dental care.  She is due for flu and COVID vaccines.  She is due for GYN exam.   Past Medical/Surgical History: Past Medical History:  Diagnosis Date   Arthritis    Complication of anesthesia    GERD (gastroesophageal reflux disease)    Irritable bowel syndrome    occationally   Meniscus tear    right   PONV (postoperative nausea and vomiting)    RA (rheumatoid arthritis) (HCC)     Past Surgical History:  Procedure Laterality Date   FOOT SURGERY     JOINT REPLACEMENT     OVARY SURGERY     REDUCTION MAMMAPLASTY  2017   TOTAL KNEE ARTHROPLASTY Right 02/15/2020   Procedure: TOTAL KNEE ARTHROPLASTY;  Surgeon: Jodi Geralds, MD;  Location: [facility];  Service: Orthopedics;  Laterality: Right;    Social History:  reports that she has never smoked. She has never used smokeless tobacco. She reports that she does not drink alcohol and does not use drugs.  Allergies: Allergies  Allergen Reactions   Omnipaque [Iohexol] Hives and Itching   Other Other (See Comments)    NO BLOOD PRODUCTS     Family History:  Family History  Problem Relation Age of Onset   Breast cancer Mother    Diabetes Mother    Hypertension Mother    Kidney disease Sister    Colon polyps Sister        x 2 sisters     Current Outpatient Medications:    acetaminophen (TYLENOL) 650 MG CR tablet, Take 650-1,300 mg by mouth every 8 (eight) hours as needed for pain., Disp: , Rfl:    aspirin EC 325 MG tablet, Take 1 tablet (325 mg total) by mouth 2 (two) times daily after a meal. Take x 1 month post op to decrease risk  of blood clots., Disp: 60 tablet, Rfl: 0   aspirin-acetaminophen-caffeine (EXCEDRIN MIGRAINE) 250-250-65 MG tablet, Take 1 tablet by mouth every 6 (six) hours as needed for headache., Disp: , Rfl:    aspirin-sod bicarb-citric acid (ALKA-SELTZER) 325 MG TBEF tablet, Take 325 mg by mouth every 6 (six) hours as needed (upset stomach)., Disp: , Rfl:    atorvastatin (LIPITOR) 20 MG tablet, Take 1 tablet (20 mg total) by mouth daily., Disp: 90 tablet, Rfl: 1   cetirizine (ZYRTEC) 10 MG tablet, Take 1 tablet (10 mg total) by mouth daily., Disp: 30 tablet, Rfl: 0   docusate sodium (COLACE) 100 MG capsule, Take 1 capsule (100 mg total) by mouth 2 (two) times daily., Disp: 30 capsule, Rfl: 0   fluticasone (FLONASE) 50 MCG/ACT nasal spray, Place 1-2 sprays into both nostrils daily as needed for allergies or rhinitis., Disp: 16 g, Rfl: 2   ibuprofen (ADVIL) 800 MG tablet, Take 1 tablet (800 mg total) by mouth every 8 (eight) hours., Disp: 30 tablet, Rfl: 1   Melatonin 2.5 MG CHEW, Chew 5 mg by mouth at bedtime as needed (sleep.)., Disp: , Rfl:    meloxicam (MOBIC) 7.5 MG tablet, Take 1 tablet (7.5 mg total) by mouth 2 (two) times daily for 2 weeks,  and then as needed, Disp: 60 tablet, Rfl: 0   Multiple Vitamin (MULITIVITAMIN WITH MINERALS) TABS, Take 1 tablet by mouth daily. Alive Women's Multivitamin 50+, Disp: , Rfl:    Naphazoline-Pheniramine (OPCON-A) 0.027-0.315 % SOLN, Place 1-2 drops into both eyes 3 (three) times daily as needed (dry/irritated eyes.)., Disp: , Rfl:    Phenyleph-Doxylamine-DM-APAP (ALKA SELTZER PLUS PO), Take 2 tablets by mouth every 4 (four) hours as needed (cold symptoms.). , Disp: , Rfl:    Senna (SENOKOT LAXATIVE GUMMIES PO), Take 1 tablet by mouth daily as needed (constipation.)., Disp: , Rfl:    vitamin C (ASCORBIC ACID) 500 MG tablet, Take 500 mg by mouth 2 (two) times a week. , Disp: , Rfl:    Vitamin D, Ergocalciferol, (DRISDOL) 1.25 MG (50000 UNIT) CAPS capsule, Take 1 capsule  (50,000 Units total) by mouth every 7 (seven) days for a total of 12 doses., Disp: 8 capsule, Rfl: 0  Review of Systems:  Constitutional: Denies fever, chills, diaphoresis, appetite change and fatigue.  HEENT: Denies photophobia, eye pain, redness, hearing loss, ear pain, congestion, sore throat, rhinorrhea, sneezing, mouth sores, trouble swallowing, neck pain, neck stiffness and tinnitus.   Respiratory: Denies SOB, DOE, cough, chest tightness,  and wheezing.   Cardiovascular: Denies chest pain, palpitations and leg swelling.  Gastrointestinal: Denies nausea, vomiting, abdominal pain, diarrhea, constipation, blood in stool and abdominal distention.  Genitourinary: Denies dysuria, urgency, frequency, hematuria, flank pain and difficulty urinating.  Endocrine: Denies: hot or cold intolerance, sweats, changes in hair or nails, polyuria, polydipsia. Musculoskeletal: Denies myalgias, back pain, joint swelling, arthralgias and gait problem.  Skin: Denies pallor, rash and wound.  Neurological: Denies dizziness, seizures, syncope, weakness, light-headedness, numbness and headaches.  Hematological: Denies adenopathy. Easy bruising, personal or family bleeding history  Psychiatric/Behavioral: Denies suicidal ideation, mood changes, confusion, nervousness, sleep disturbance and agitation    Physical Exam: Vitals:   04/15/22 1306  BP: 124/80  Pulse: 74  Temp: 98.5 F (36.9 C)  TempSrc: Oral  SpO2: 99%  Weight: 165 lb 12.8 oz (75.2 kg)  Height: 4\' 11"  (1.499 m)    Body mass index is 33.49 kg/m.   Constitutional: NAD, calm, comfortable Eyes: PERRL, lids and conjunctivae normal ENMT: Mucous membranes are moist. Posterior pharynx clear of any exudate or lesions. Normal dentition. Tympanic membrane is pearly white, no erythema or bulging. Neck: normal, supple, no masses, no thyromegaly Respiratory: clear to auscultation bilaterally, no wheezing, no crackles. Normal respiratory effort. No  accessory muscle use.  Cardiovascular: Regular rate and rhythm, no murmurs / rubs / gallops. No extremity edema. 2+ pedal pulses. No carotid bruits.  Abdomen: no tenderness, no masses palpated. No hepatosplenomegaly. Bowel sounds positive.  Musculoskeletal: no clubbing / cyanosis. No joint deformity upper and lower extremities. Good ROM, no contractures. Normal muscle tone.  Skin: no rashes, lesions, ulcers. No induration Neurologic: CN 2-12 grossly intact. Sensation intact, DTR normal. Strength 5/5 in all 4.  Psychiatric: Normal judgment and insight. Alert and oriented x 3. Normal mood.    Flowsheet Row Office Visit from 04/15/2022 in [facility] [place]  PHQ-9 Total Score 0        Impression and Plan:  Encounter for preventive health examination -Recommend routine eye and dental care. -Immunizations: Flu vaccine in office today, advised bivalent COVID -Healthy lifestyle discussed in detail. -Labs to be updated today. -Colon cancer screening: 08/2013, 10-year callback -Breast cancer screening: 09/2021 -Cervical cancer screening: Overdue -Lung cancer screening: Never smoker, not applicable -Prostate cancer  screening: Not applicable -DEXA: Not applicable  IGT (impaired glucose tolerance) - Plan: Hemoglobin A1c  Rheumatoid arthritis involving multiple sites, unspecified whether rheumatoid factor present (HCC) - Plan: CBC with Differential/Platelet, Comprehensive metabolic panel, TSH, Vitamin B12 -Followed by rheumatology  Mixed hyperlipidemia - Plan: Lipid panel  Vitamin D deficiency - Plan: VITAMIN D 25 Hydroxy (Vit-D Deficiency, Fractures)  Need for influenza vaccination -Flu vaccine administered today  Encounter for screening for HIV - Plan: HIV antibody (with reflex)  Encounter for hepatitis C screening test for low risk patient - Plan: Hep C Antibody    Patient Instructions  -Nice seeing you today!!  -Lab work today; will notify you once results are  available.  -Flu vaccine today.  -Remember your COVID vaccine.  -Remember to schedule follow up with GYN.      Chaya Jan, MD [facility]

## 2022-04-15 NOTE — Patient Instructions (Signed)
-  Nice seeing you today!!  -Lab work today; will notify you once results are available.  -Flu vaccine today.  -Remember your COVID vaccine.  -Remember to schedule follow up with GYN.

## 2022-04-15 NOTE — Addendum Note (Signed)
Addended by: Kern Reap B on: 04/15/2022 02:00 PM   Modules accepted: Orders

## 2022-04-16 LAB — HIV ANTIBODY (ROUTINE TESTING W REFLEX): HIV 1&2 Ab, 4th Generation: NONREACTIVE

## 2022-04-16 LAB — HEPATITIS C ANTIBODY: Hepatitis C Ab: NONREACTIVE

## 2022-04-20 ENCOUNTER — Other Ambulatory Visit: Payer: Self-pay | Admitting: *Deleted

## 2022-04-20 DIAGNOSIS — R7302 Impaired glucose tolerance (oral): Secondary | ICD-10-CM

## 2022-04-30 ENCOUNTER — Other Ambulatory Visit: Payer: Self-pay | Admitting: Internal Medicine

## 2022-04-30 DIAGNOSIS — R52 Pain, unspecified: Secondary | ICD-10-CM

## 2022-04-30 DIAGNOSIS — R609 Edema, unspecified: Secondary | ICD-10-CM

## 2022-05-03 ENCOUNTER — Encounter: Payer: No Typology Code available for payment source | Admitting: Internal Medicine

## 2022-05-04 ENCOUNTER — Other Ambulatory Visit (HOSPITAL_COMMUNITY): Payer: Self-pay

## 2022-05-04 ENCOUNTER — Other Ambulatory Visit: Payer: Self-pay | Admitting: Internal Medicine

## 2022-05-24 ENCOUNTER — Other Ambulatory Visit: Payer: No Typology Code available for payment source

## 2022-08-24 IMAGING — MG DIGITAL SCREENING BILAT W/ TOMO W/ CAD
6 of 10 series · 6 of 30 positions shown · non-contrast
Comparison: Previous exam(s).

CLINICAL DATA: Screening.

EXAM:
DIGITAL SCREENING BILATERAL MAMMOGRAM WITH TOMO AND CAD

[R CC synth-2D]
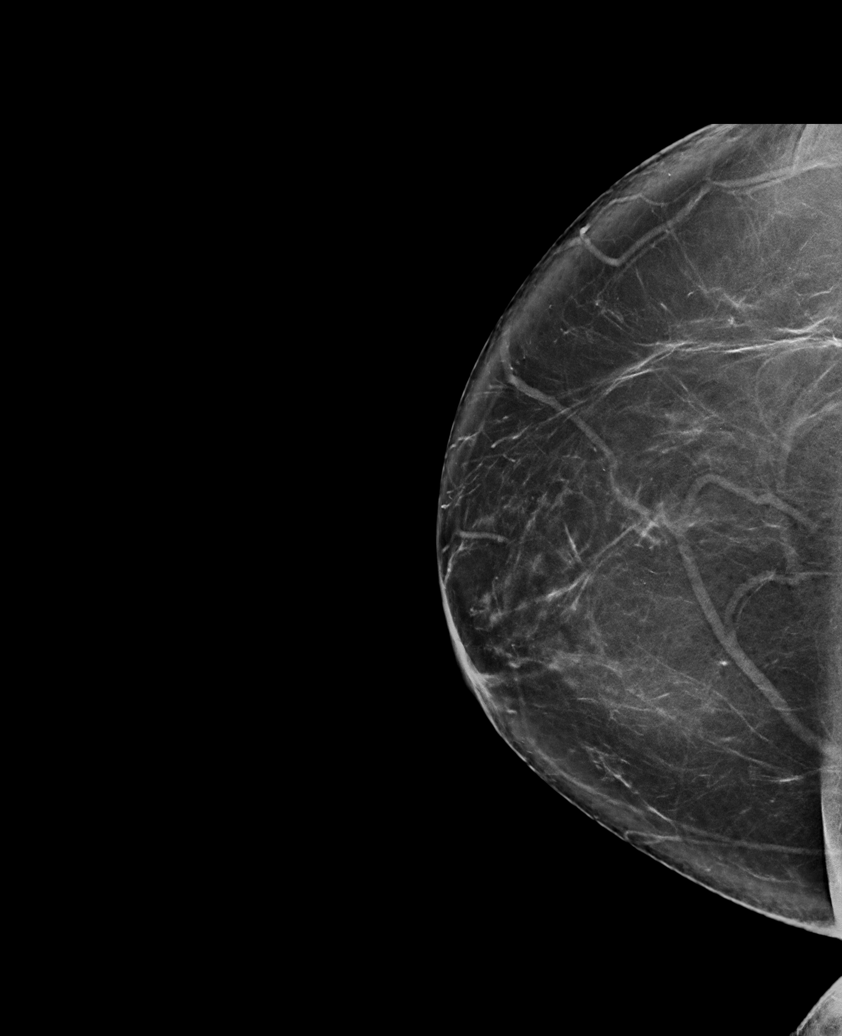

[R MLO synth-2D (1 of 2)]
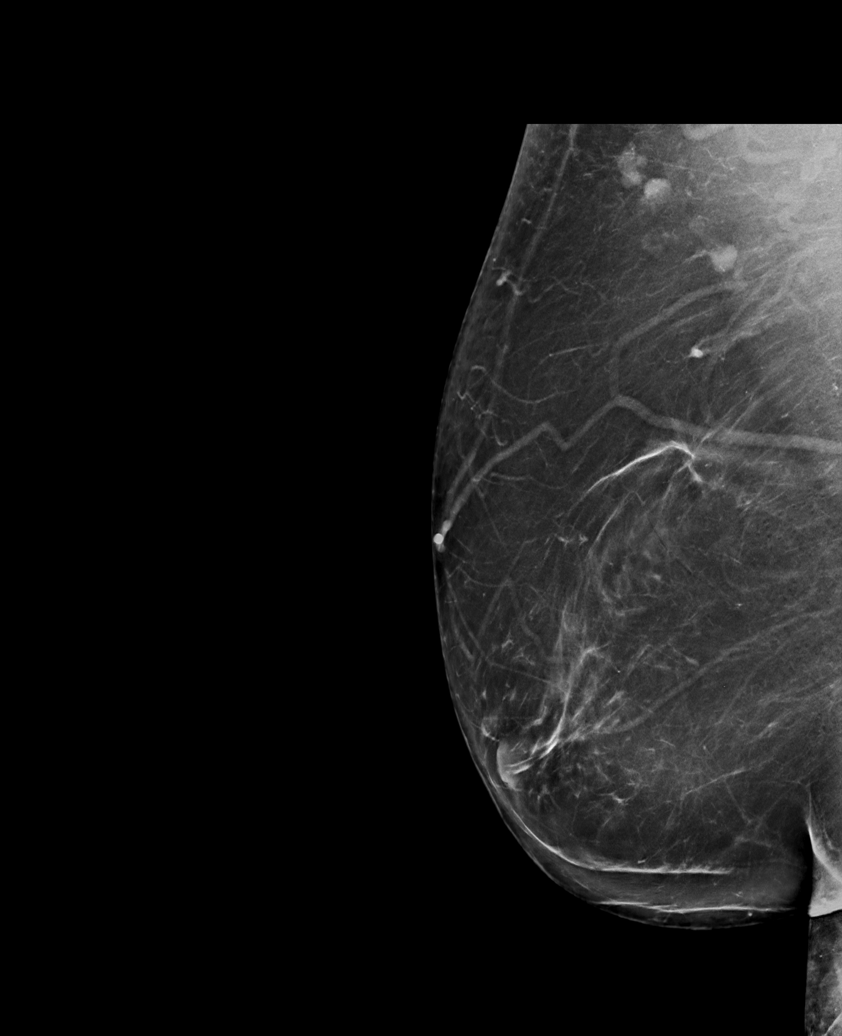

[L CC synth-2D]
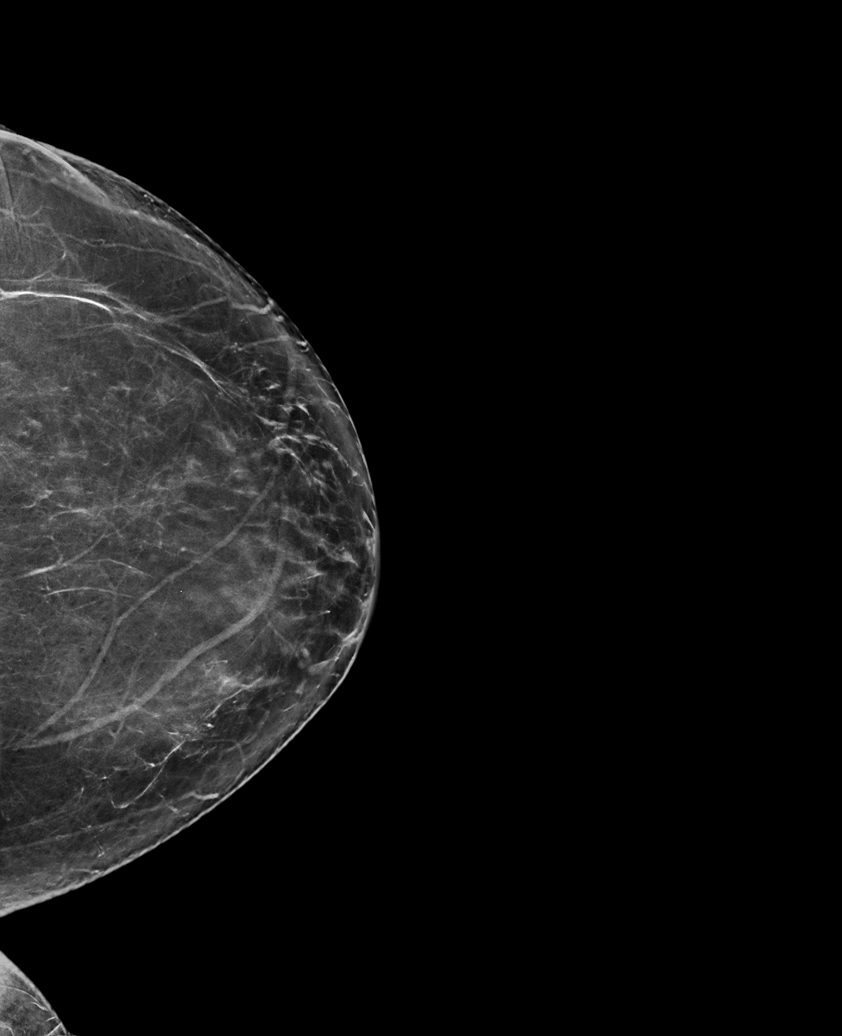

[R MLO synth-2D (2 of 2)]
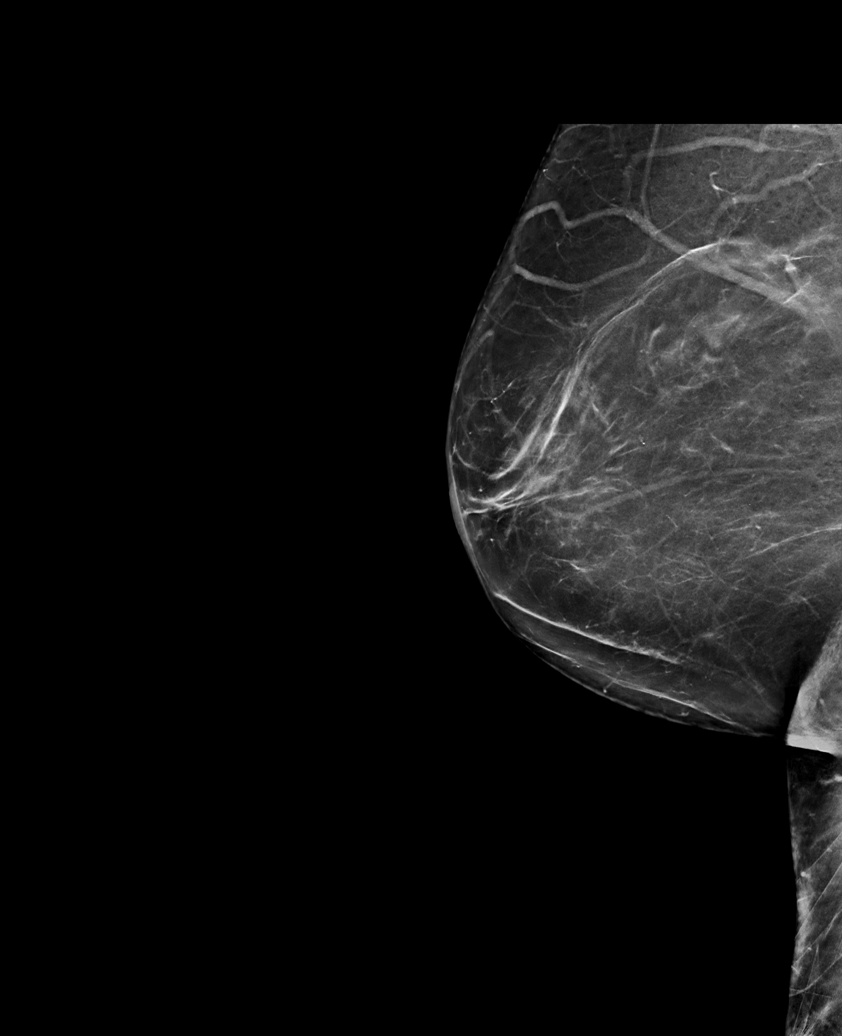

[L MLO synth-2D]
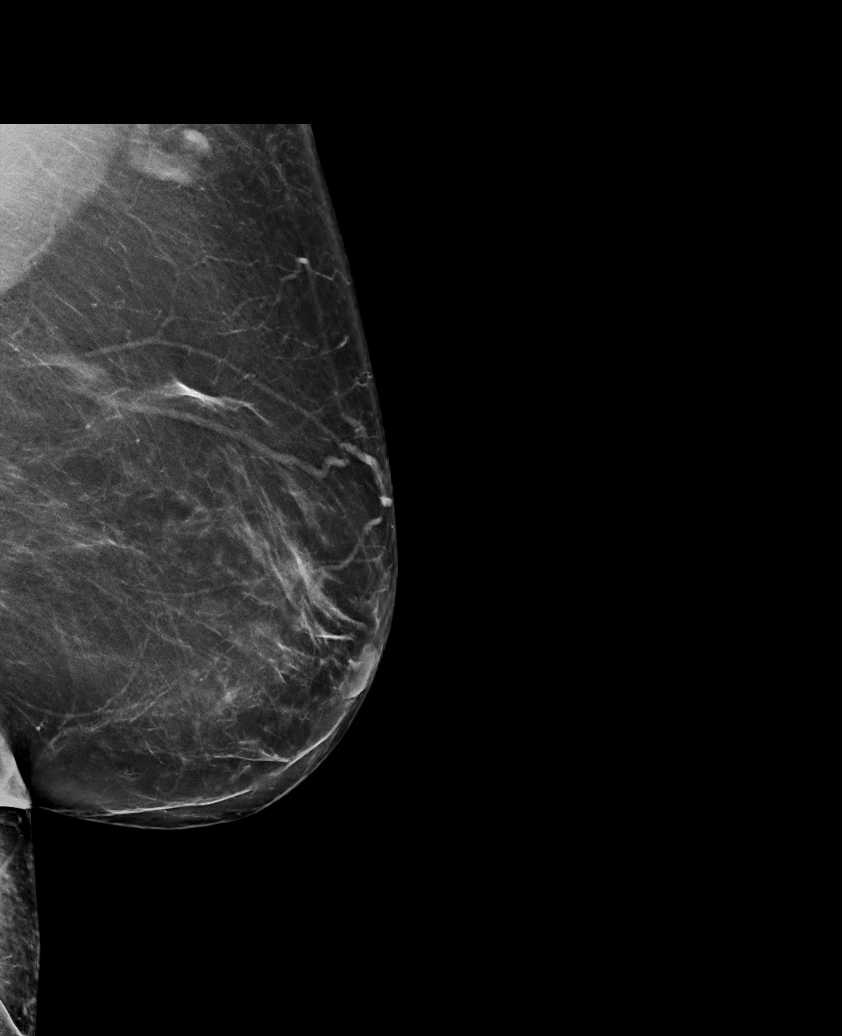

[R MLO tomo · tomo slice 43/85.0]
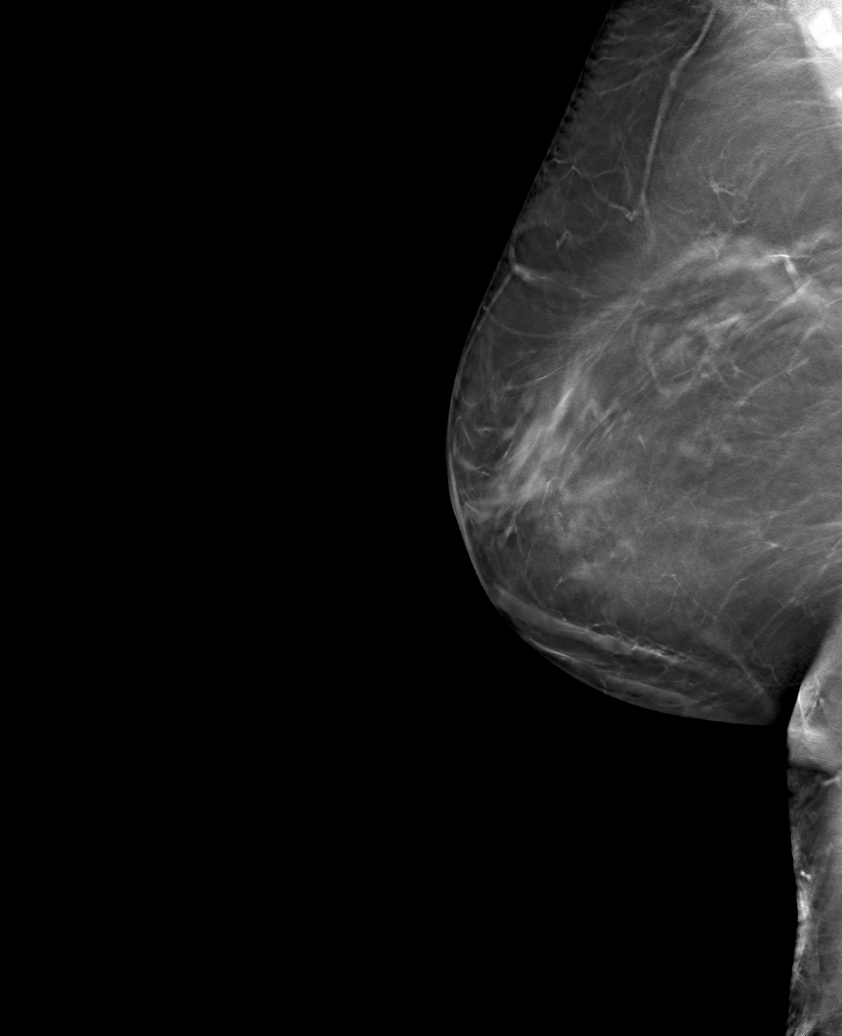

[6 of 30 positions shown; findings below may reference images not displayed]

ACR Breast Density Category b: There are scattered areas of
fibroglandular density.
FINDINGS: There are no findings suspicious for malignancy. Images were
processed with CAD.
IMPRESSION: No mammographic evidence of malignancy. A result letter of this
screening mammogram will be mailed directly to the patient.

RECOMMENDATION:
Screening mammogram in one year. (Code:[id number])

BI-RADS CATEGORY  1: Negative.

## 2022-08-25 ENCOUNTER — Other Ambulatory Visit (HOSPITAL_COMMUNITY): Payer: Self-pay

## 2022-08-25 ENCOUNTER — Telehealth: Payer: Self-pay | Admitting: Internal Medicine

## 2022-08-25 DIAGNOSIS — E782 Mixed hyperlipidemia: Secondary | ICD-10-CM

## 2022-08-25 MED ORDER — ATORVASTATIN CALCIUM 20 MG PO TABS
20.0000 mg | ORAL_TABLET | Freq: Every day | ORAL | 1 refills | Status: DC
  Filled 2022-08-25: qty 90, 90d supply, fill #0
  Filled 2022-12-10: qty 90, 90d supply, fill #1

## 2022-08-25 NOTE — Addendum Note (Signed)
Addended by: Westley Hummer B on: 08/25/2022 01:47 PM   Modules accepted: Orders

## 2022-08-25 NOTE — Telephone Encounter (Signed)
Refill sent.

## 2022-08-25 NOTE — Telephone Encounter (Signed)
atorvastatin (LIPITOR) 20 MG tablet   [place] Phone: [phone]  Fax: [phone]

## 2022-09-24 ENCOUNTER — Other Ambulatory Visit (HOSPITAL_COMMUNITY): Payer: Self-pay

## 2022-09-24 DIAGNOSIS — M7541 Impingement syndrome of right shoulder: Secondary | ICD-10-CM | POA: Diagnosis not present

## 2022-09-24 MED ORDER — MELOXICAM 15 MG PO TABS
15.0000 mg | ORAL_TABLET | Freq: Every day | ORAL | 1 refills | Status: DC
  Filled 2022-09-24: qty 30, 30d supply, fill #0

## 2022-11-10 ENCOUNTER — Other Ambulatory Visit: Payer: Self-pay | Admitting: Internal Medicine

## 2022-11-10 DIAGNOSIS — Z1231 Encounter for screening mammogram for malignant neoplasm of breast: Secondary | ICD-10-CM

## 2022-11-15 ENCOUNTER — Encounter: Payer: Self-pay | Admitting: Skilled Nursing Facility1

## 2022-11-15 ENCOUNTER — Encounter: Payer: 59 | Attending: Internal Medicine | Admitting: Skilled Nursing Facility1

## 2022-11-15 DIAGNOSIS — E631 Imbalance of constituents of food intake: Secondary | ICD-10-CM | POA: Insufficient documentation

## 2022-11-15 NOTE — Progress Notes (Signed)
Medical Nutrition Therapy   Primary concerns today: prediabetes  Referral diagnosis: [employer] employee Preferred learning style: visual Learning readiness: contemplating   NUTRITION ASSESSMENT   Clinical Medical Hx: GERD, prediabetes, IBS, hypercholesterolemia  Medications: N/A Labs: A1C 6.1 Notable Signs/Symptoms: spasms at night in her legs   Lifestyle & Dietary Hx  Pt states she would like information on pre-diabetes and preventing DM.  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  Pt states she does not eat a meal while at work.  Working: 7pm-7:30am Pt states she is In bed before 10am. Pt states she is wokring on getting a treadmill at home.   Pt states she wishes to be taught how to use her glucometer at next appt.   Estimated daily fluid intake: 80+ oz Supplements: cholesterol vitamin (lecithin, garlic) Sleep: 12 hour days on those days sleep is rough  Stress / self-care: no issues reported  Current average weekly physical activity: ADL's  24-Hr Dietary Recall First Meal 5pm: rotisserie chicken and salad + salsa + cheese or frozen meal + salad Snack:  Second Meal: 2 pack of peanut butter crackers or ritz crackers and 100 calorie nuts  Snack:  Third Meal: avocado toast and sausage or cereal  Snack:  Beverages: water + lemon, harris teeter sweet tea, HiC, flavored, premade iced coffee    NUTRITION INTERVENTION  Nutrition education (E-1) on the following topics:  Creation of balanced and diverse meals to increase the intake of nutrient-rich foods that provide essential vitamins, minerals, fiber, and phytonutrients Variety of Fruits and Vegetables: Aim for a colorful array of fruits and vegetables to ensure a wide range of nutrients. Include a mix of leafy greens, berries, citrus fruits, cruciferous vegetables, and more. Whole  Grains: Choose whole grains over refined grains. Examples include brown rice, quinoa, oats, whole wheat, and barley. Lean Proteins: Include lean sources of protein, such as poultry, fish, tofu, legumes, beans, lentils, and low-fat dairy products. Limit red and processed meats. Healthy Fats: Incorporate sources of healthy fats, including avocados, nuts, seeds, and olive oil. Limit saturated and trans fats found in fried and processed foods. Dairy or Dairy Alternatives: Choose low-fat or fat-free dairy products, or plant-based alternatives like almond or soy milk. Portion Control: Be mindful of portion sizes to avoid overeating. Pay attention to hunger and satisfaction cues. Limit Added Sugars: Minimize the consumption of sugary beverages, snacks, and desserts. Check food labels for added sugars and opt for natural sources of sweetness such as whole fruits. Hydration: Drink plenty of water throughout the day. Limit sugary drinks and excessive caffeine intake. Moderate Sodium Intake: Reduce the consumption of high-sodium foods. Use herbs and spices for flavor instead of excessive salt. Meal Planning and Preparation: Plan and prepare meals ahead of time to make healthier choices more convenient. Include a mix of food groups in each meal. Limit Processed Foods: Minimize the intake of highly processed and packaged foods that are often high in added sugars, salt, and unhealthy fats. Regular Physical Activity: Combine a healthy diet with regular physical activity for overall well-being. Aim for at least 150 minutes of moderate-intensity aerobic exercise per week, along with strength training. Moderation and Balance: Enjoy treats and indulgent foods in moderation, emphasizing balance rather than strict restriction.  Handouts Provided Include  Detailed trifold MyPlate  Learning Style &  Readiness for Change Teaching method utilized: Visual & Auditory  Demonstrated degree of understanding via:  Teach Back  Barriers to learning/adherence to lifestyle change: none identified    MONITORING & EVALUATION Dietary intake, weekly physical activity  Next Steps  Patient is to follow up for second employee appt.

## 2022-12-10 ENCOUNTER — Other Ambulatory Visit (HOSPITAL_COMMUNITY): Payer: Self-pay

## 2022-12-13 ENCOUNTER — Other Ambulatory Visit (HOSPITAL_COMMUNITY): Payer: Self-pay

## 2022-12-28 ENCOUNTER — Other Ambulatory Visit (HOSPITAL_COMMUNITY): Payer: Self-pay

## 2023-01-03 ENCOUNTER — Ambulatory Visit
Admission: RE | Admit: 2023-01-03 | Discharge: 2023-01-03 | Disposition: A | Discharge status: Home / Self Care | Payer: 59 | Source: Ambulatory Visit | Attending: Internal Medicine | Admitting: Internal Medicine

## 2023-01-03 DIAGNOSIS — Z1231 Encounter for screening mammogram for malignant neoplasm of breast: Secondary | ICD-10-CM

## 2023-01-03 DIAGNOSIS — Z01419 Encounter for gynecological examination (general) (routine) without abnormal findings: Secondary | ICD-10-CM | POA: Diagnosis not present

## 2023-01-03 DIAGNOSIS — Z6833 Body mass index (BMI) 33.0-33.9, adult: Secondary | ICD-10-CM | POA: Diagnosis not present

## 2023-01-10 ENCOUNTER — Encounter: Payer: 59 | Attending: Internal Medicine | Admitting: Skilled Nursing Facility1

## 2023-01-10 ENCOUNTER — Ambulatory Visit: Payer: 59 | Admitting: Skilled Nursing Facility1

## 2023-01-10 ENCOUNTER — Encounter: Payer: Self-pay | Admitting: Skilled Nursing Facility1

## 2023-01-10 DIAGNOSIS — E631 Imbalance of constituents of food intake: Secondary | ICD-10-CM

## 2023-01-10 NOTE — Progress Notes (Signed)
Medical Nutrition Therapy   Primary concerns today: prediabetes  Referral diagnosis: [employer] employee Preferred learning style: visual Learning readiness: contemplating   NUTRITION ASSESSMENT   Clinical Medical Hx: GERD, prediabetes, IBS, hypercholesterolemia  Medications: N/A Labs: A1C 6.1 Notable Signs/Symptoms: spasms at night in her legs   Lifestyle & Dietary Hx  Educated pt on the use of a glucometer.   Check her blood sugar and got 100. Pt states she started buying wheat thins and rice cakes.  Estimated daily fluid intake: 80+ oz Supplements: cholesterol vitamin (lecithin, garlic) Sleep: 12 hour days on those days sleep is rough  Stress / self-care: no issues reported  Current average weekly physical activity: ADL's; some walking [place]  24-Hr Dietary Recall First Meal 5pm: breakfast burrito from [employer]  Snack:  Second Meal: 2 pack of peanut butter crackers or ritz crackers and 100 calorie nuts  Snack:  Third Meal: avocado toast and sausage or cereal  Snack:  Beverages: water + lemon, harris teeter sweet tea, HiC, flavored, premade iced coffee    NUTRITION INTERVENTION  Nutrition education (E-1) on the following topics:  Creation of balanced and diverse meals to increase the intake of nutrient-rich foods that provide essential vitamins, minerals, fiber, and phytonutrients Variety of Fruits and Vegetables: Aim for a colorful array of fruits and vegetables to ensure a wide range of nutrients. Include a mix of leafy greens, berries, citrus fruits, cruciferous vegetables, and more. Whole Grains: Choose whole grains over refined grains. Examples include brown rice, quinoa, oats, whole wheat, and barley. Lean Proteins: Include lean sources of protein, such as poultry, fish, tofu, legumes, beans, lentils, and low-fat dairy products. Limit red and processed meats. Healthy Fats: Incorporate sources of healthy fats, including avocados, nuts, seeds, and olive  oil. Limit saturated and trans fats found in fried and processed foods. Dairy or Dairy Alternatives: Choose low-fat or fat-free dairy products, or plant-based alternatives like almond or soy milk. Portion Control: Be mindful of portion sizes to avoid overeating. Pay attention to hunger and satisfaction cues. Limit Added Sugars: Minimize the consumption of sugary beverages, snacks, and desserts. Check food labels for added sugars and opt for natural sources of sweetness such as whole fruits. Hydration: Drink plenty of water throughout the day. Limit sugary drinks and excessive caffeine intake. Moderate Sodium Intake: Reduce the consumption of high-sodium foods. Use herbs and spices for flavor instead of excessive salt. Meal Planning and Preparation: Plan and prepare meals ahead of time to make healthier choices more convenient. Include a mix of food groups in each meal. Limit Processed Foods: Minimize the intake of highly processed and packaged foods that are often high in added sugars, salt, and unhealthy fats. Regular Physical Activity: Combine a healthy diet with regular physical activity for overall well-being. Aim for at least 150 minutes of moderate-intensity aerobic exercise per week, along with strength training. Moderation and Balance: Enjoy treats and indulgent foods in moderation, emphasizing balance rather than strict restriction.  Handouts Previously Provided Include  Detailed trifold MyPlate  Learning Style & Readiness for Change Teaching method utilized: Visual & Auditory  Demonstrated degree of understanding via: Teach Back  Barriers to learning/adherence to lifestyle change: none identified    MONITORING & EVALUATION Dietary intake, weekly physical activity

## 2023-01-14 ENCOUNTER — Telehealth: Payer: Self-pay | Admitting: Internal Medicine

## 2023-01-14 NOTE — Telephone Encounter (Signed)
Prescription Request  01/14/2023  LOV: 04/15/2022    What is the name of the medication or equipment? ibuprofen (ADVIL) 800 MG tablet     Have you contacted your pharmacy to request a refill? Yes   Which pharmacy would you like this sent to?  [facility] [address] [place] [address] Phone: [phone] Fax: [phone]    Patient notified that their request is being sent to the clinical staff for review and that they should receive a response within 2 business days.   Please advise at Mobile [phone] (mobile)

## 2023-01-18 ENCOUNTER — Other Ambulatory Visit: Payer: Self-pay | Admitting: Internal Medicine

## 2023-01-18 NOTE — Addendum Note (Signed)
Addended by: Kern Reap B on: 01/18/2023 11:54 AM   Modules accepted: Orders

## 2023-01-18 NOTE — Telephone Encounter (Signed)
Denial sent to the pharmacy. 

## 2023-03-03 ENCOUNTER — Other Ambulatory Visit: Payer: Self-pay

## 2023-03-23 ENCOUNTER — Encounter (INDEPENDENT_AMBULATORY_CARE_PROVIDER_SITE_OTHER): Payer: Self-pay

## 2023-03-31 ENCOUNTER — Other Ambulatory Visit: Payer: Self-pay | Admitting: Internal Medicine

## 2023-03-31 DIAGNOSIS — E782 Mixed hyperlipidemia: Secondary | ICD-10-CM

## 2023-04-01 ENCOUNTER — Other Ambulatory Visit: Payer: Self-pay | Admitting: Internal Medicine

## 2023-04-01 ENCOUNTER — Other Ambulatory Visit (HOSPITAL_COMMUNITY): Payer: Self-pay

## 2023-04-01 ENCOUNTER — Other Ambulatory Visit (HOSPITAL_BASED_OUTPATIENT_CLINIC_OR_DEPARTMENT_OTHER): Payer: Self-pay

## 2023-04-01 DIAGNOSIS — E782 Mixed hyperlipidemia: Secondary | ICD-10-CM

## 2023-04-01 MED ORDER — ATORVASTATIN CALCIUM 20 MG PO TABS
20.0000 mg | ORAL_TABLET | Freq: Every day | ORAL | 0 refills | Status: DC
  Filled 2023-04-01: qty 90, 90d supply, fill #0

## 2023-04-05 ENCOUNTER — Other Ambulatory Visit (HOSPITAL_COMMUNITY): Payer: Self-pay

## 2023-04-08 DIAGNOSIS — H52223 Regular astigmatism, bilateral: Secondary | ICD-10-CM | POA: Diagnosis not present

## 2023-04-08 DIAGNOSIS — H524 Presbyopia: Secondary | ICD-10-CM | POA: Diagnosis not present

## 2023-04-12 ENCOUNTER — Ambulatory Visit (INDEPENDENT_AMBULATORY_CARE_PROVIDER_SITE_OTHER): Payer: 59 | Admitting: Internal Medicine

## 2023-04-12 ENCOUNTER — Other Ambulatory Visit (HOSPITAL_COMMUNITY): Payer: Self-pay

## 2023-04-12 ENCOUNTER — Encounter: Payer: Self-pay | Admitting: Internal Medicine

## 2023-04-12 VITALS — BP 130/80 | HR 79 | Temp 98.1°F | Ht 58.5 in | Wt 160.0 lb

## 2023-04-12 DIAGNOSIS — E559 Vitamin D deficiency, unspecified: Secondary | ICD-10-CM | POA: Diagnosis not present

## 2023-04-12 DIAGNOSIS — E782 Mixed hyperlipidemia: Secondary | ICD-10-CM | POA: Diagnosis not present

## 2023-04-12 DIAGNOSIS — J309 Allergic rhinitis, unspecified: Secondary | ICD-10-CM

## 2023-04-12 DIAGNOSIS — Z0000 Encounter for general adult medical examination without abnormal findings: Secondary | ICD-10-CM

## 2023-04-12 DIAGNOSIS — R7302 Impaired glucose tolerance (oral): Secondary | ICD-10-CM

## 2023-04-12 DIAGNOSIS — M069 Rheumatoid arthritis, unspecified: Secondary | ICD-10-CM | POA: Diagnosis not present

## 2023-04-12 LAB — TSH: TSH: 1.13 u[IU]/mL (ref 0.35–5.50)

## 2023-04-12 LAB — LIPID PANEL
Cholesterol: 163 mg/dL (ref 0–200)
HDL: 33.1 mg/dL — ABNORMAL LOW (ref >39.00)
LDL Cholesterol: 90 mg/dL (ref 0–99)
NonHDL: 129.86
Total CHOL/HDL Ratio: 5
Triglycerides: 197 mg/dL — ABNORMAL HIGH (ref 0.0–149.0)
VLDL: 39.4 mg/dL (ref 0.0–40.0)

## 2023-04-12 LAB — CBC WITH DIFFERENTIAL/PLATELET
Basophils Absolute: 0.1 10*3/uL (ref 0.0–0.1)
Basophils Relative: 0.7 % (ref 0.0–3.0)
Eosinophils Absolute: 0.4 10*3/uL (ref 0.0–0.7)
Eosinophils Relative: 4.6 % (ref 0.0–5.0)
HCT: 40.7 % (ref 36.0–46.0)
Hemoglobin: 13.4 g/dL (ref 12.0–15.0)
Lymphocytes Relative: 20.8 % (ref 12.0–46.0)
Lymphs Abs: 1.7 10*3/uL (ref 0.7–4.0)
MCHC: 32.9 g/dL (ref 30.0–36.0)
MCV: 90.2 fl (ref 78.0–100.0)
Monocytes Absolute: 0.5 10*3/uL (ref 0.1–1.0)
Monocytes Relative: 6.3 % (ref 3.0–12.0)
Neutro Abs: 5.7 10*3/uL (ref 1.4–7.7)
Neutrophils Relative %: 67.6 % (ref 43.0–77.0)
Platelets: 243 10*3/uL (ref 150.0–400.0)
RBC: 4.52 Mil/uL (ref 3.87–5.11)
RDW: 14.1 % (ref 11.5–15.5)
WBC: 8.4 10*3/uL (ref 4.0–10.5)

## 2023-04-12 LAB — COMPREHENSIVE METABOLIC PANEL
ALT: 31 U/L (ref 0–35)
AST: 21 U/L (ref 0–37)
Albumin: 4.2 g/dL (ref 3.5–5.2)
Alkaline Phosphatase: 65 U/L (ref 39–117)
BUN: 16 mg/dL (ref 6–23)
CO2: 28 mEq/L (ref 19–32)
Calcium: 9.9 mg/dL (ref 8.4–10.5)
Chloride: 105 mEq/L (ref 96–112)
Creatinine, Ser: 0.85 mg/dL (ref 0.40–1.20)
GFR: 75.45 mL/min (ref >60.00)
Glucose, Bld: 93 mg/dL (ref 70–99)
Potassium: 3.9 mEq/L (ref 3.5–5.1)
Sodium: 140 mEq/L (ref 135–145)
Total Bilirubin: 0.6 mg/dL (ref 0.2–1.2)
Total Protein: 7.7 g/dL (ref 6.0–8.3)

## 2023-04-12 LAB — HEMOGLOBIN A1C: Hgb A1c MFr Bld: 6 % (ref 4.6–6.5)

## 2023-04-12 LAB — VITAMIN D 25 HYDROXY (VIT D DEFICIENCY, FRACTURES): VITD: 64.16 ng/mL (ref 30.00–100.00)

## 2023-04-12 LAB — VITAMIN B12: Vitamin B-12: 505 pg/mL (ref 211–911)

## 2023-04-12 MED ORDER — IBUPROFEN 800 MG PO TABS
800.0000 mg | ORAL_TABLET | Freq: Three times a day (TID) | ORAL | 1 refills | Status: DC
Start: 2023-04-12 — End: 2024-06-07
  Filled 2023-04-12: qty 30, 10d supply, fill #0
  Filled 2023-08-16: qty 30, 10d supply, fill #1

## 2023-04-12 MED ORDER — FLUTICASONE PROPIONATE 50 MCG/ACT NA SUSP
1.0000 | Freq: Every day | NASAL | 2 refills | Status: DC | PRN
Start: 2023-04-12 — End: 2024-06-08
  Filled 2023-04-12: qty 16, 30d supply, fill #0
  Filled 2024-03-23: qty 16, 30d supply, fill #1

## 2023-04-12 NOTE — Progress Notes (Signed)
Established Patient Office Visit     CC/Reason for Visit: Annual preventive exam  HPI: Danielle Pace is a 57 y.o. female who is coming in today for the above mentioned reasons. Past Medical History is significant for: Hyperlipidemia, arthritis, vitamin D deficiency, impaired glucose tolerance.  Has been feeling well without acute concerns or complaints, needs some medication refills.  Will be due for 10-year colonoscopy in January 2025, mammogram and Pap smear are up-to-date.  She has routine eye and dental care.  She will be due for her seasonal vaccines soon.   Past Medical/Surgical History: Past Medical History:  Diagnosis Date   Arthritis    Complication of anesthesia    GERD (gastroesophageal reflux disease)    Irritable bowel syndrome    occationally   Meniscus tear    right   PONV (postoperative nausea and vomiting)    RA (rheumatoid arthritis) (HCC)     Past Surgical History:  Procedure Laterality Date   FOOT SURGERY     JOINT REPLACEMENT     OVARY SURGERY     REDUCTION MAMMAPLASTY  2017   TOTAL KNEE ARTHROPLASTY Right 02/15/2020   Procedure: TOTAL KNEE ARTHROPLASTY;  Surgeon: Jodi Geralds, MD;  Location: [facility];  Service: Orthopedics;  Laterality: Right;    Social History:  reports that she has never smoked. She has never used smokeless tobacco. She reports that she does not drink alcohol and does not use drugs.  Allergies: Allergies  Allergen Reactions   Omnipaque [Iohexol] Hives and Itching   Other Other (See Comments)    NO BLOOD PRODUCTS     Family History:  Family History  Problem Relation Age of Onset   Breast cancer Mother    Diabetes Mother    Hypertension Mother    Kidney disease Sister    Colon polyps Sister        x 2 sisters     Current Outpatient Medications:    acetaminophen (TYLENOL) 650 MG CR tablet, Take 650-1,300 mg by mouth every 8 (eight) hours as needed for pain., Disp: , Rfl:    aspirin EC 325 MG tablet, Take 1  tablet (325 mg total) by mouth 2 (two) times daily after a meal. Take x 1 month post op to decrease risk of blood clots., Disp: 60 tablet, Rfl: 0   aspirin-acetaminophen-caffeine (EXCEDRIN MIGRAINE) 250-250-65 MG tablet, Take 1 tablet by mouth every 6 (six) hours as needed for headache., Disp: , Rfl:    aspirin-sod bicarb-citric acid (ALKA-SELTZER) 325 MG TBEF tablet, Take 325 mg by mouth every 6 (six) hours as needed (upset stomach)., Disp: , Rfl:    atorvastatin (LIPITOR) 20 MG tablet, Take 1 tablet (20 mg total) by mouth daily., Disp: 90 tablet, Rfl: 0   cetirizine (ZYRTEC) 10 MG tablet, Take 1 tablet (10 mg total) by mouth daily., Disp: 30 tablet, Rfl: 0   docusate sodium (COLACE) 100 MG capsule, Take 1 capsule (100 mg total) by mouth 2 (two) times daily., Disp: 30 capsule, Rfl: 0   ibuprofen (ADVIL) 800 MG tablet, Take 1 tablet (800 mg total) by mouth every 8 (eight) hours., Disp: 30 tablet, Rfl: 1   Melatonin 2.5 MG CHEW, Chew 5 mg by mouth at bedtime as needed (sleep.)., Disp: , Rfl:    Multiple Vitamin (MULITIVITAMIN WITH MINERALS) TABS, Take 1 tablet by mouth daily. Alive Women's Multivitamin 50+, Disp: , Rfl:    Naphazoline-Pheniramine (OPCON-A) 0.027-0.315 % SOLN, Place 1-2 drops into both eyes 3 (  three) times daily as needed (dry/irritated eyes.)., Disp: , Rfl:    Phenyleph-Doxylamine-DM-APAP (ALKA SELTZER PLUS PO), Take 2 tablets by mouth every 4 (four) hours as needed (cold symptoms.). , Disp: , Rfl:    Senna (SENOKOT LAXATIVE GUMMIES PO), Take 1 tablet by mouth daily as needed (constipation.)., Disp: , Rfl:    vitamin C (ASCORBIC ACID) 500 MG tablet, Take 500 mg by mouth 2 (two) times a week. , Disp: , Rfl:    Vitamin D, Ergocalciferol, (DRISDOL) 1.25 MG (50000 UNIT) CAPS capsule, Take 1 capsule (50,000 Units total) by mouth every 7 (seven) days for a total of 12 doses., Disp: 8 capsule, Rfl: 0   fluticasone (FLONASE) 50 MCG/ACT nasal spray, Place 1-2 sprays into both nostrils daily as  needed for allergies or rhinitis., Disp: 16 g, Rfl: 2   meloxicam (MOBIC) 15 MG tablet, Take 1 tablet (15 mg total) by mouth daily with meals. (Patient not taking: Reported on 04/12/2023), Disp: 30 tablet, Rfl: 1   meloxicam (MOBIC) 7.5 MG tablet, Take 1 tablet (7.5 mg total) by mouth 2 (two) times daily for 2 weeks, and then as needed (Patient not taking: Reported on 04/12/2023), Disp: 60 tablet, Rfl: 0  Review of Systems:  Negative unless indicated in HPI.   Physical Exam: Vitals:   04/12/23 1257  BP: 130/80  Pulse: 79  Temp: 98.1 F (36.7 C)  TempSrc: Oral  SpO2: 98%  Weight: 160 lb (72.6 kg)  Height: 4' 10.5" (1.486 m)    Body mass index is 32.87 kg/m.   Physical Exam Vitals reviewed.  Constitutional:      General: She is not in acute distress.    Appearance: Normal appearance. She is not ill-appearing, toxic-appearing or diaphoretic.  HENT:     Head: Normocephalic.     Right Ear: Tympanic membrane, ear canal and external ear normal. There is no impacted cerumen.     Left Ear: Tympanic membrane, ear canal and external ear normal. There is no impacted cerumen.     Nose: Nose normal.     Mouth/Throat:     Mouth: Mucous membranes are moist.     Pharynx: Oropharynx is clear. No oropharyngeal exudate or posterior oropharyngeal erythema.  Eyes:     General: No scleral icterus.       Right eye: No discharge.        Left eye: No discharge.     Conjunctiva/sclera: Conjunctivae normal.     Pupils: Pupils are equal, round, and reactive to light.  Neck:     Vascular: No carotid bruit.  Cardiovascular:     Rate and Rhythm: Normal rate and regular rhythm.     Pulses: Normal pulses.     Heart sounds: Normal heart sounds.  Pulmonary:     Effort: Pulmonary effort is normal. No respiratory distress.     Breath sounds: Normal breath sounds.  Abdominal:     General: Abdomen is flat. Bowel sounds are normal.     Palpations: Abdomen is soft.  Musculoskeletal:        General:  Normal range of motion.     Cervical back: Normal range of motion.  Skin:    General: Skin is warm and dry.  Neurological:     General: No focal deficit present.     Mental Status: She is alert and oriented to person, place, and time. Mental status is at baseline.  Psychiatric:        Mood and Affect: Mood normal.  Behavior: Behavior normal.        Thought Content: Thought content normal.        Judgment: Judgment normal.     Flowsheet Row Office Visit from 04/12/2023 in [facility] [place]  PHQ-9 Total Score 0        Impression and Plan:  Encounter for preventive health examination  Mixed hyperlipidemia -     Lipid panel; Future  IGT (impaired glucose tolerance) -     Comprehensive metabolic panel; Future -     TSH; Future -     Vitamin B12; Future -     Hemoglobin A1c; Future -     CBC with Differential/Platelet; Future  Vitamin D deficiency -     VITAMIN D 25 Hydroxy (Vit-D Deficiency, Fractures); Future  Allergic rhinitis, unspecified seasonality, unspecified trigger -     Fluticasone Propionate; Place 1-2 sprays into both nostrils daily as needed for allergies or rhinitis.  Dispense: 16 g; Refill: 2   -Recommend routine eye and dental care. -Healthy lifestyle discussed in detail. -Labs to be updated today. -Prostate cancer screening: N/A Health Maintenance  Topic Date Due   COVID-19 Vaccine (3 - 2023-24 season) 04/23/2022   Pap Smear  04/12/2023*   Flu Shot  11/21/2023*   Colon Cancer Screening  09/14/2023   Mammogram  01/02/2025   DTaP/Tdap/Td vaccine (2 - Td or Tdap) 08/24/2027   Hepatitis C Screening  Completed   HIV Screening  Completed   Zoster (Shingles) Vaccine  Completed   HPV Vaccine  Aged Out  *Topic was postponed. The date shown is not the original due date.   -Advised to update flu and COVID vaccines. -Obtain records from GYN.     Chaya Jan, MD [facility]

## 2023-04-14 ENCOUNTER — Encounter: Payer: Self-pay | Admitting: Obstetrics and Gynecology

## 2023-04-15 ENCOUNTER — Other Ambulatory Visit (HOSPITAL_COMMUNITY): Payer: Self-pay

## 2023-04-18 ENCOUNTER — Other Ambulatory Visit (HOSPITAL_COMMUNITY): Payer: Self-pay

## 2023-04-26 ENCOUNTER — Encounter: Payer: 59 | Admitting: Internal Medicine

## 2023-07-26 ENCOUNTER — Other Ambulatory Visit: Payer: Self-pay | Admitting: Internal Medicine

## 2023-07-26 ENCOUNTER — Other Ambulatory Visit (HOSPITAL_COMMUNITY): Payer: Self-pay

## 2023-07-26 DIAGNOSIS — E782 Mixed hyperlipidemia: Secondary | ICD-10-CM

## 2023-07-26 MED ORDER — ATORVASTATIN CALCIUM 20 MG PO TABS
20.0000 mg | ORAL_TABLET | Freq: Every day | ORAL | 1 refills | Status: DC
Start: 2023-07-26 — End: 2023-12-07
  Filled 2023-07-26: qty 90, 90d supply, fill #0
  Filled 2023-12-07: qty 90, 90d supply, fill #1

## 2023-09-07 ENCOUNTER — Encounter: Payer: Self-pay | Admitting: Gastroenterology

## 2023-09-21 ENCOUNTER — Ambulatory Visit: Payer: 59 | Admitting: Podiatry

## 2023-10-10 ENCOUNTER — Ambulatory Visit: Payer: Self-pay | Admitting: Podiatry

## 2023-10-24 ENCOUNTER — Encounter: Payer: Self-pay | Admitting: Podiatry

## 2023-10-24 ENCOUNTER — Ambulatory Visit (INDEPENDENT_AMBULATORY_CARE_PROVIDER_SITE_OTHER): Payer: 59 | Admitting: Podiatry

## 2023-10-24 DIAGNOSIS — B351 Tinea unguium: Secondary | ICD-10-CM | POA: Diagnosis not present

## 2023-10-24 DIAGNOSIS — M79674 Pain in right toe(s): Secondary | ICD-10-CM | POA: Diagnosis not present

## 2023-10-24 DIAGNOSIS — M79675 Pain in left toe(s): Secondary | ICD-10-CM

## 2023-10-24 DIAGNOSIS — M21172 Varus deformity, not elsewhere classified, left ankle: Secondary | ICD-10-CM | POA: Diagnosis not present

## 2023-10-24 NOTE — Progress Notes (Signed)
   Subjective:  58 y.o. female presenting today for follow-up evaluation of bilateral ankle pain.  Patient states that she works at [employer] long on her feet all day and experiences significant pain and tenderness.  Since last visit the pain has subsided somewhat.  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  Patient also complains of thick dystrophic nails 1-5 bilateral.  She says that they are very tender and painful in shoes.  She is unable to trim her own toenails.   Past Medical History:  Diagnosis Date   Arthritis    Complication of anesthesia    GERD (gastroesophageal reflux disease)    Irritable bowel syndrome    occationally   Meniscus tear    right   PONV (postoperative nausea and vomiting)    RA (rheumatoid arthritis) (HCC)      Objective / Physical Exam:  General:  The patient is alert and oriented x3 in no acute distress. Dermatology:  Skin is warm, dry and supple bilateral lower extremities. Negative for open lesions or macerations.  Hyperkeratotic dystrophic nails noted 1-5 bilateral Vascular:  Palpable pedal pulses bilaterally. No edema or erythema noted. Capillary refill within normal limits. Neurological:  Grossly intact via light touch Musculoskeletal Exam:  Pain on palpation to the anterior lateral medial aspects of the patient's bilateral ankles. Mild edema noted.   Range of motion within normal limits to all pedal and ankle joints bilateral. Muscle strength 5/5 in all groups bilateral.  Also noted is an elevated first ray.  Forefoot varus noted.  Reducible.  Assessment: 1.  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  Chronic intermittent capsulitis with DJD bilateral ankles 3.  Pain due to onychomycosis of toenails both   Plan of Care:  -Patient evaluated -Today we discussed custom orthotics and custom orthopedic shoes versus OTC prefabricated insoles and shoes.  For now I recommend shoes at the shoe market on [address].   Recommend good stability shoes -Advise against going barefoot -Mechanical debridement of nails 1-5 bilateral was performed using a nail nipper without incident or bleeding -Return to clinic as needed  Felecia Shelling, DPM [facility]  Dr. Felecia Shelling, DPM    [address] [place], [place] [address]                Office [phone]  Fax [phone]

## 2023-12-07 ENCOUNTER — Other Ambulatory Visit (HOSPITAL_COMMUNITY): Payer: Self-pay

## 2023-12-07 ENCOUNTER — Other Ambulatory Visit: Payer: Self-pay | Admitting: Internal Medicine

## 2023-12-07 DIAGNOSIS — E782 Mixed hyperlipidemia: Secondary | ICD-10-CM

## 2023-12-07 NOTE — Telephone Encounter (Signed)
 Copied from CRM [id number]. Topic: Clinical - Medication Refill >> Dec 07, 2023  2:26 PM Danae Duncans wrote: Most Recent Primary Care Visit:  Provider: Raeanne Bull  Department: [facility]  Visit Type: PHYSICAL  Date: 04/12/2023  Medication: atorvastatin (LIPITOR) 20 MG tablet  Has the patient contacted their pharmacy? Yes (Agent: If no, request that the patient contact the pharmacy for the refill. If patient does not wish to contact the pharmacy document the reason why and proceed with request.) (Agent: If yes, when and what did the pharmacy advise?)  Is this the correct pharmacy for this prescription? Yes If no, delete pharmacy and type the correct one.  This is the patient's preferred pharmacy:  [place] [facility] [address] [place] [address] Phone: [phone] Fax: [phone]   Has the prescription been filled recently? No  Is the patient out of the medication? Yes  Has the patient been seen for an appointment in the last year OR does the patient have an upcoming appointment? Yes  Can we respond through MyChart? Yes  Agent: Please be advised that Rx refills may take up to 3 business days. We ask that you follow-up with your pharmacy.

## 2023-12-12 ENCOUNTER — Other Ambulatory Visit (HOSPITAL_COMMUNITY): Payer: Self-pay

## 2023-12-12 MED ORDER — ATORVASTATIN CALCIUM 20 MG PO TABS
20.0000 mg | ORAL_TABLET | Freq: Every day | ORAL | 1 refills | Status: AC
Start: 2023-12-12 — End: ?
  Filled 2023-12-12 – 2024-03-23 (×2): qty 90, 90d supply, fill #0

## 2023-12-13 ENCOUNTER — Other Ambulatory Visit: Payer: Self-pay | Admitting: Internal Medicine

## 2023-12-13 DIAGNOSIS — Z1231 Encounter for screening mammogram for malignant neoplasm of breast: Secondary | ICD-10-CM

## 2024-01-02 ENCOUNTER — Encounter (HOSPITAL_COMMUNITY): Payer: Self-pay

## 2024-01-09 ENCOUNTER — Ambulatory Visit
Admission: RE | Admit: 2024-01-09 | Discharge: 2024-01-09 | Disposition: A | Discharge status: Home / Self Care | Source: Ambulatory Visit | Attending: Internal Medicine | Admitting: Internal Medicine

## 2024-01-09 DIAGNOSIS — Z1231 Encounter for screening mammogram for malignant neoplasm of breast: Secondary | ICD-10-CM

## 2024-02-06 ENCOUNTER — Telehealth: Payer: Self-pay

## 2024-02-06 NOTE — Telephone Encounter (Signed)
 Copied from CRM [id number]. Topic: Clinical - Medication Question >> Feb 06, 2024  4:31 PM Danielle Pace wrote: Reason for CRM: Pt states she would like to know if Zepbound is now approved for [employer]. States this is for diabetics but she is pre-diabetic and wonders if this would qualify.  Pt callback is [phone].   If so, please send RX to [place] [facility] [address], [address] [place] [address] Phone: [phone] Fax: [phone] Hours: M-F 7:30am-6pm

## 2024-02-13 NOTE — Telephone Encounter (Signed)
 Left detailed message on machine for patient that she does not qualify for Zepbound.

## 2024-02-28 DIAGNOSIS — Z6833 Body mass index (BMI) 33.0-33.9, adult: Secondary | ICD-10-CM | POA: Diagnosis not present

## 2024-02-28 DIAGNOSIS — N393 Stress incontinence (female) (male): Secondary | ICD-10-CM | POA: Diagnosis not present

## 2024-02-28 DIAGNOSIS — Z01419 Encounter for gynecological examination (general) (routine) without abnormal findings: Secondary | ICD-10-CM | POA: Diagnosis not present

## 2024-03-07 ENCOUNTER — Ambulatory Visit: Admitting: Podiatry

## 2024-03-23 ENCOUNTER — Other Ambulatory Visit: Payer: Self-pay

## 2024-03-23 ENCOUNTER — Other Ambulatory Visit (HOSPITAL_COMMUNITY): Payer: Self-pay

## 2024-03-23 ENCOUNTER — Other Ambulatory Visit: Payer: Self-pay | Admitting: Internal Medicine

## 2024-03-23 DIAGNOSIS — E782 Mixed hyperlipidemia: Secondary | ICD-10-CM

## 2024-03-23 NOTE — Telephone Encounter (Signed)
 Copied from CRM [id number]. Topic: Clinical - Medication Refill >> Mar 23, 2024  1:55 PM Shardie S wrote: Medication: atorvastatin  (LIPITOR) 20 MG tablet  Has the patient contacted their pharmacy? Yes, contact PCP (Agent: If no, request that the patient contact the pharmacy for the refill. If patient does not wish to contact the pharmacy document the reason why and proceed with request.) (Agent: If yes, when and what did the pharmacy advise?)  This is the patient's preferred pharmacy:  [place] [facility] [address] [place] [address] Phone: [phone] Fax: [phone]  Is this the correct pharmacy for this prescription? Yes If no, delete pharmacy and type the correct one.   Has the prescription been filled recently? No  Is the patient out of the medication? Yes  Has the patient been seen for an appointment in the last year OR does the patient have an upcoming appointment? yes  Can we respond through MyChart? No  Agent: Please be advised that Rx refills may take up to 3 business days. We ask that you follow-up with your pharmacy.

## 2024-03-26 ENCOUNTER — Other Ambulatory Visit (HOSPITAL_COMMUNITY): Payer: Self-pay

## 2024-03-26 MED ORDER — ATORVASTATIN CALCIUM 20 MG PO TABS
20.0000 mg | ORAL_TABLET | Freq: Every day | ORAL | 0 refills | Status: DC
  Filled 2024-03-26: qty 90, 90d supply, fill #0

## 2024-04-24 DIAGNOSIS — H52223 Regular astigmatism, bilateral: Secondary | ICD-10-CM | POA: Diagnosis not present

## 2024-04-24 DIAGNOSIS — H524 Presbyopia: Secondary | ICD-10-CM | POA: Diagnosis not present

## 2024-05-07 NOTE — Therapy (Signed)
 OUTPATIENT PHYSICAL THERAPY FEMALE PELVIC EVALUATION   Patient Name: Danielle Pace MRN: [id number] DOB:[birth year], 58 y.o., female Today's Date: 05/08/2024  END OF SESSION:  PT End of Session - 05/08/24 0933     Visit Number 1    Date for PT Re-Evaluation 11/05/24    Authorization Type [facility] [employer]  No Auth Required    Progress Note Due on Visit 0    PT Start Time 0815    PT Stop Time 0847    PT Time Calculation (min) 32 min    Activity Tolerance Patient tolerated treatment well    Behavior During Therapy WFL for tasks assessed/performed          Past Medical History:  Diagnosis Date   Arthritis    Complication of anesthesia    GERD (gastroesophageal reflux disease)    Irritable bowel syndrome    occationally   Meniscus tear    right   PONV (postoperative nausea and vomiting)    RA (rheumatoid arthritis) (HCC)    Past Surgical History:  Procedure Laterality Date   FOOT SURGERY     JOINT REPLACEMENT     OVARY SURGERY     REDUCTION MAMMAPLASTY  2017   TOTAL KNEE ARTHROPLASTY Right 02/15/2020   Procedure: TOTAL KNEE ARTHROPLASTY;  Surgeon: Yvone Rush, MD;  Location: [facility];  Service: Orthopedics;  Laterality: Right;   Patient Active Problem List   Diagnosis Date Noted   IGT (impaired glucose tolerance) 01/28/2021   Primary osteoarthritis of right knee 02/15/2020   RA (rheumatoid arthritis) (HCC)    Rhinitis, chronic 02/20/2019   Sore throat 02/20/2019   Deviated septum 10/19/2018   Epistaxis, recurrent 10/19/2018   Breast hypertrophy in female 11/03/2015   Vitamin D  deficiency 06/27/2014   Hyperlipidemia 06/27/2014   Dyspepsia and other specified disorders of function of stomach 08/08/2013   Special screening for malignant neoplasms, colon 08/08/2013    PCP: Theophilus Andrews, Tully GRADE, MD  REFERRING PROVIDER: Latisha Medford, MD REFERRING DIAG: N39.3 (ICD-10-CM) - Stress incontinence (female) (female)  THERAPY DIAG:  Other lack of  coordination  Muscle weakness (generalized)  Rationale for Evaluation and Treatment: Rehabilitation  ONSET DATE: 3 years ago- 2022  SUBJECTIVE:                                                                                                                                                                                           SUBJECTIVE STATEMENT: 15 mins late for eval Patient reports that she leaks with sneezing, coughing, laughing, got worse in the last 3 years  Fluid intake: water with lemon, iced coffee, sparkling water  FUNCTIONAL  LIMITATIONS: none  PERTINENT HISTORY:  Medications for current condition: no Surgeries: R knee replacement , left knee meniscus tear Other: none Sexual abuse: No  DIAGNOSTIC FINDINGS:  Post-void residual: Voiding Cystourethrogram (VCUG):  Ultrasound: PAIN:  Are you having pain? No NPRS scale: 0/10   PRECAUTIONS: None  RED FLAGS: None   WEIGHT BEARING RESTRICTIONS: No  FALLS:  Has patient fallen in last 6 months? Yes. 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 [employer]  ACTIVITY LEVEL : sits at work, walks with dogs, has a treadmill  PLOF: Independent  PATIENT GOALS: to do treadmill   BOWEL MOVEMENT: no issues  Fiber supplement/laxative Yes stool softener at times  URINATION: Pain with urination: No Fully empty bladder: Yes:                                   Post-void dribble: No Stream: Strong Urgency: sometimes with a cold Frequency:during the night few times                                                         Nocturia: Yes:     Leakage: Coughing, Sneezing, and Laughing Pads/briefs: Yes: 1  INTERCOURSE: not active   PREGNANCY: no kids   PROLAPSE: None   OBJECTIVE:  Note: Objective measures were completed at Evaluation unless otherwise noted.  DIAGNOSTIC FINDINGS:  none  PATIENT SURVEYS:    PFIQ-7: 0  COGNITION: Overall cognitive status: Within functional limits for  tasks assessed     SENSATION: Light touch: Appears intact  LUMBAR SPECIAL TESTS:  Single leg stance test: Positive for bilateral lower extremity weakness, more on left Bilateral hamstring tightness   FUNCTIONAL TESTS:              Sit-up test: 1/4 Squat: difficult Bed mobility: within functional limitations   GAIT: Assistive device utilized: None  POSTURE: rounded shoulders, forward head, and increased thoracic kyphosis   LUMBARAROM/PROM:  A/PROM A/PROM  Eval (% available)  Flexion 75  Extension 75  Right lateral flexion 75  Left lateral flexion 75  Right rotation 75  Left rotation 75   (Blank rows = not tested)  LOWER EXTREMITY ROM:  Passive ROM Right eval Left eval  Hip flexion    Hip extension    Hip abduction    Hip adduction    Hip internal rotation    Hip external rotation    Knee flexion    Knee extension    Ankle dorsiflexion    Ankle plantarflexion    Ankle inversion    Ankle eversion     (Blank rows = not tested)  LOWER EXTREMITY MMT:  MMT Right eval Left eval  Hip flexion 4-/5 4/5  Hip extension    Hip abduction    Hip adduction    Hip internal rotation    Hip external rotation    Knee flexion    Knee extension 4/5 4/5  Ankle dorsiflexion    Ankle plantarflexion    Ankle inversion    Ankle eversion     (Blank rows = not tested) PALPATION:  General: bilateral upper trap trigger points and tightness  Pelvic Alignment: even  Abdominal: gripping and bracing with upper chest breathing strategies  Diastasis: No but difficult to assess due to adipose tissue Distortion: No  Breathing:  gripping and bracing with upper chest breathing strategies Scar tissue: No                External Perineal Exam: to be assessed ( patient late to eval)                             Internal Pelvic Floor: to be assessed ( patient late to eval)  Patient confirms identification and approves PT to assess internal pelvic floor and treatment  Yes  PELVIC MMT:   MMT eval  Vaginal   Internal Anal Sphincter   External Anal Sphincter   Puborectalis   Diastasis Recti   (Blank rows = not tested)        TONE: To be assessed   PROLAPSE: To be assessed   TODAY'S TREATMENT:                                                                                                                              DATE: 05/08/2024  EVAL  Examination completed, findings reviewed, pt educated on POC, HEP, and female pelvic floor anatomy, reasoning with pelvic floor assessment internally with pt consent. Pt motivated to participate in PT and agreeable to attempt recommendations.    Neuro reed- ball press with transverse abdominis breath 5 reps Cough with pelvic floor contraction 3 reps Diaphragmatic breathing reed  PATIENT EDUCATION:  Education details: Pt was educated on relevant anatomy, exam findings, home exercise program, plan of care, expectations of PT   Person educated: Patient Education method: Explanation, Demonstration, Tactile cues, Verbal cues, and Handouts Education comprehension: verbalized understanding and needs further education  HOME EXERCISE PROGRAM: Access Code: [email or web address] URL: [email or web address] Date: 05/08/2024 Prepared by: Cori Asif Muchow  Exercises - Abdominal Press into [place]  - 1 x daily - 7 x weekly - 2 sets - 10 reps - Seated Cough with Pelvic Floor Contraction and Hand to Mouth  - 1 x daily - 7 x weekly - 2 sets - 10 reps - Supine Bridge with Pelvic Floor Contraction  - 1 x daily - 7 x weekly - 2 sets - 10 reps  ASSESSMENT:  CLINICAL IMPRESSION: Patient is a 58 y.o. F who was seen today for physical therapy evaluation and treatment for stress urinary incontinence. She reported that she has never had children and is not sexually active. She has had leaking with sneezing and coughing that worsened in the last 3 years with bad colds. Patient was late for her initial evaluation and internal pelvic floor  exam is postponed to next visit. She does present with right hip weakness, has a history of several knee surgeries and a foot as well. Upper chest breathing strategies with decreased use of respiratory diaphragm, no prolapse symptoms, reported constipation at times. She was educated on HEP focusing on coordination of core and  pelvic floor with cough and hip strengthening. Patient is sedentary, her goal is to walk more on her treadmill. She will benefit from PT to address deficits and improve core strength.   OBJECTIVE IMPAIRMENTS: Abnormal gait, decreased coordination, difficulty walking, decreased ROM, decreased strength, and obesity.   ACTIVITY LIMITATIONS: continence and toileting  PARTICIPATION LIMITATIONS: community activity  PERSONAL FACTORS: Age and Fitness are also affecting patient's functional outcome.   REHAB POTENTIAL: Excellent  CLINICAL DECISION MAKING: Stable/uncomplicated  EVALUATION COMPLEXITY: Low   GOALS: Goals reviewed with patient? Yes  SHORT TERM GOALS: Target date: 06/05/2024    Pt will be independent with HEP.   Baseline: Goal status: INITIAL  2.  Patient will be educated on bladder irritants Baseline:  Goal status: INITIAL  3.  Patient will complete a bladder diary Baseline:  Goal status: INITIAL  4.  Patient will demonstrate improved diaphragmatic breath and abdominal muscle activation without bracing to have improved core coordination Baseline:  Goal status: INITIAL   LONG TERM GOALS: Target date: 11/05/2024  Pt will soak 0 pads/ day in order to run errands and not have to interrupt daily tasks.  Baseline:  Goal status: INITIAL  2. Pt will be independent with advanced HEP.   Baseline:  Goal status: INITIAL  3.  Patient will demonstrate 5/5 bilateral hip and knee strength Baseline:  Goal status: INITIAL  4.  Pt will be able to functional actions such as coughing, sneezing and laughing without leakage  Baseline:  Goal status:  INITIAL    PLAN:  PT FREQUENCY: 1x/week  PT DURATION: 6 months  PLANNED INTERVENTIONS: 97110-Therapeutic exercises, 97530- Therapeutic activity, 97112- Neuromuscular re-education, 97535- Self Care, [id number]- Manual therapy, [phone]- Electrical stimulation (manual), Patient/Family education, Joint mobilization, Joint manipulation, Spinal manipulation, Spinal mobilization, Scar mobilization, Cryotherapy, Moist heat, and Biofeedback  PLAN FOR NEXT SESSION: continue core strengthening, hip strengthening, focus on breathing, reduce breath holding, internal pelvic floor assessment, ask about estrogen   Atha Muradyan, PT 05/08/2024, 9:34 AM

## 2024-05-08 ENCOUNTER — Encounter: Payer: Self-pay | Admitting: Physical Therapy

## 2024-05-08 ENCOUNTER — Other Ambulatory Visit: Payer: Self-pay

## 2024-05-08 ENCOUNTER — Ambulatory Visit: Attending: Obstetrics and Gynecology | Admitting: Physical Therapy

## 2024-05-08 DIAGNOSIS — R278 Other lack of coordination: Secondary | ICD-10-CM | POA: Diagnosis not present

## 2024-05-08 DIAGNOSIS — M6281 Muscle weakness (generalized): Secondary | ICD-10-CM | POA: Insufficient documentation

## 2024-06-07 ENCOUNTER — Encounter (INDEPENDENT_AMBULATORY_CARE_PROVIDER_SITE_OTHER): Payer: Self-pay

## 2024-06-07 ENCOUNTER — Other Ambulatory Visit (HOSPITAL_COMMUNITY): Payer: Self-pay

## 2024-06-07 ENCOUNTER — Encounter: Payer: Self-pay | Admitting: Internal Medicine

## 2024-06-07 ENCOUNTER — Telehealth: Payer: Self-pay | Admitting: *Deleted

## 2024-06-07 ENCOUNTER — Ambulatory Visit: Payer: Self-pay | Admitting: Internal Medicine

## 2024-06-07 VITALS — BP 134/70 | HR 64 | Ht 59.0 in | Wt 166.1 lb

## 2024-06-07 DIAGNOSIS — Z23 Encounter for immunization: Secondary | ICD-10-CM

## 2024-06-07 DIAGNOSIS — Z0000 Encounter for general adult medical examination without abnormal findings: Secondary | ICD-10-CM | POA: Diagnosis not present

## 2024-06-07 DIAGNOSIS — E782 Mixed hyperlipidemia: Secondary | ICD-10-CM

## 2024-06-07 DIAGNOSIS — M069 Rheumatoid arthritis, unspecified: Secondary | ICD-10-CM | POA: Diagnosis not present

## 2024-06-07 DIAGNOSIS — R7302 Impaired glucose tolerance (oral): Secondary | ICD-10-CM | POA: Diagnosis not present

## 2024-06-07 DIAGNOSIS — E66811 Obesity, class 1: Secondary | ICD-10-CM | POA: Diagnosis not present

## 2024-06-07 DIAGNOSIS — Z1211 Encounter for screening for malignant neoplasm of colon: Secondary | ICD-10-CM

## 2024-06-07 DIAGNOSIS — Z1283 Encounter for screening for malignant neoplasm of skin: Secondary | ICD-10-CM

## 2024-06-07 DIAGNOSIS — E559 Vitamin D deficiency, unspecified: Secondary | ICD-10-CM | POA: Diagnosis not present

## 2024-06-07 DIAGNOSIS — Z6833 Body mass index (BMI) 33.0-33.9, adult: Secondary | ICD-10-CM | POA: Diagnosis not present

## 2024-06-07 LAB — CBC WITH DIFFERENTIAL/PLATELET
Basophils Absolute: 0.1 K/uL (ref 0.0–0.1)
Basophils Relative: 0.9 % (ref 0.0–3.0)
Eosinophils Absolute: 0.3 K/uL (ref 0.0–0.7)
Eosinophils Relative: 4.4 % (ref 0.0–5.0)
HCT: 41.5 % (ref 36.0–46.0)
Hemoglobin: 13.8 g/dL (ref 12.0–15.0)
Lymphocytes Relative: 18.8 % (ref 12.0–46.0)
Lymphs Abs: 1.3 K/uL (ref 0.7–4.0)
MCHC: 33.3 g/dL (ref 30.0–36.0)
MCV: 89.5 fl (ref 78.0–100.0)
Monocytes Absolute: 0.5 K/uL (ref 0.1–1.0)
Monocytes Relative: 7.5 % (ref 3.0–12.0)
Neutro Abs: 4.8 K/uL (ref 1.4–7.7)
Neutrophils Relative %: 68.4 % (ref 43.0–77.0)
Platelets: 247 K/uL (ref 150.0–400.0)
RBC: 4.63 Mil/uL (ref 3.87–5.11)
RDW: 14 % (ref 11.5–15.5)
WBC: 7 K/uL (ref 4.0–10.5)

## 2024-06-07 LAB — COMPREHENSIVE METABOLIC PANEL WITH GFR
ALT: 20 U/L (ref 0–35)
AST: 18 U/L (ref 0–37)
Albumin: 4.2 g/dL (ref 3.5–5.2)
Alkaline Phosphatase: 64 U/L (ref 39–117)
BUN: 15 mg/dL (ref 6–23)
CO2: 28 meq/L (ref 19–32)
Calcium: 9.6 mg/dL (ref 8.4–10.5)
Chloride: 104 meq/L (ref 96–112)
Creatinine, Ser: 0.83 mg/dL (ref 0.40–1.20)
GFR: 77.01 mL/min (ref >60.00)
Glucose, Bld: 89 mg/dL (ref 70–99)
Potassium: 4.2 meq/L (ref 3.5–5.1)
Sodium: 142 meq/L (ref 135–145)
Total Bilirubin: 0.6 mg/dL (ref 0.2–1.2)
Total Protein: 7.3 g/dL (ref 6.0–8.3)

## 2024-06-07 LAB — LIPID PANEL
Cholesterol: 156 mg/dL (ref 0–200)
HDL: 33.5 mg/dL — ABNORMAL LOW (ref >39.00)
LDL Cholesterol: 83 mg/dL (ref 0–99)
NonHDL: 122.17
Total CHOL/HDL Ratio: 5
Triglycerides: 198 mg/dL — ABNORMAL HIGH (ref 0.0–149.0)
VLDL: 39.6 mg/dL (ref 0.0–40.0)

## 2024-06-07 LAB — VITAMIN D 25 HYDROXY (VIT D DEFICIENCY, FRACTURES): VITD: 45.73 ng/mL (ref 30.00–100.00)

## 2024-06-07 LAB — TSH: TSH: 1.99 u[IU]/mL (ref 0.35–5.50)

## 2024-06-07 LAB — HEMOGLOBIN A1C: Hgb A1c MFr Bld: 6.2 % (ref 4.6–6.5)

## 2024-06-07 LAB — VITAMIN B12: Vitamin B-12: >1500 pg/mL — ABNORMAL HIGH (ref 211–911)

## 2024-06-07 MED ORDER — IBUPROFEN 800 MG PO TABS
800.0000 mg | ORAL_TABLET | Freq: Three times a day (TID) | ORAL | 1 refills | Status: AC
Start: 2024-06-07 — End: ?
  Filled 2024-06-07: qty 30, 10d supply, fill #0

## 2024-06-07 NOTE — Telephone Encounter (Signed)
 Copied from CRM [id number]. Topic: Clinical - Lab/Test Results >> Jun 07, 2024  4:40 PM Lauren C wrote: Reason for CRM: Pt is looking at labs on mychart and wants to let us  know that she is taking b12 gummy vitamins per day. She wants to know if it is okay that her b12 is that high. Please let her know at # on file or mychart message.

## 2024-06-07 NOTE — Progress Notes (Signed)
 Established Patient Office Visit     CC/Reason for Visit: Annual preventive exam and yearly follow-up chronic medical conditions  HPI: Danielle Pace is a 58 y.o. female who is coming in today for the above mentioned reasons. Past Medical History is significant for: Hyperlipidemia, vitamin D  deficiency, impaired glucose tolerance, obesity and rheumatoid arthritis.  She has been feeling well.  Is due for flu and PCV 20 and COVID vaccines.  Is overdue for her 10-year colonoscopy.  Other cancer screening is up-to-date.  She is requesting referral to dermatology for skin cancer screening.  Eye and dental care is up-to-date.   Past Medical/Surgical History: Past Medical History:  Diagnosis Date   Arthritis    Complication of anesthesia    GERD (gastroesophageal reflux disease)    Irritable bowel syndrome    occationally   Meniscus tear    right   PONV (postoperative nausea and vomiting)    RA (rheumatoid arthritis) (HCC)     Past Surgical History:  Procedure Laterality Date   FOOT SURGERY     JOINT REPLACEMENT     OVARY SURGERY     REDUCTION MAMMAPLASTY  2017   TOTAL KNEE ARTHROPLASTY Right 02/15/2020   Procedure: TOTAL KNEE ARTHROPLASTY;  Surgeon: Yvone Rush, MD;  Location: [facility];  Service: Orthopedics;  Laterality: Right;    Social History:  reports that she has never smoked. She has never used smokeless tobacco. She reports that she does not drink alcohol and does not use drugs.  Allergies: Allergies  Allergen Reactions   Omnipaque  [Iohexol ] Hives and Itching   Other Other (See Comments)    NO BLOOD PRODUCTS     Family History:  Family History  Problem Relation Age of Onset   Breast cancer Mother    Diabetes Mother    Hypertension Mother    Kidney disease Sister    Colon polyps Sister        x 2 sisters     Current Outpatient Medications:    acetaminophen  (TYLENOL ) 650 MG CR tablet, Take 650-1,300 mg by mouth every 8 (eight) hours as needed for  pain., Disp: , Rfl:    aspirin  EC 325 MG tablet, Take 1 tablet (325 mg total) by mouth 2 (two) times daily after a meal. Take x 1 month post op to decrease risk of blood clots., Disp: 60 tablet, Rfl: 0   aspirin -acetaminophen -caffeine (EXCEDRIN MIGRAINE) 250-250-65 MG tablet, Take 1 tablet by mouth every 6 (six) hours as needed for headache., Disp: , Rfl:    aspirin -sod bicarb-citric acid (ALKA-SELTZER) 325 MG TBEF tablet, Take 325 mg by mouth every 6 (six) hours as needed (upset stomach)., Disp: , Rfl:    atorvastatin  (LIPITOR) 20 MG tablet, Take 1 tablet (20 mg total) by mouth daily., Disp: 90 tablet, Rfl: 0   cetirizine  (ZYRTEC ) 10 MG tablet, Take 1 tablet (10 mg total) by mouth daily., Disp: 30 tablet, Rfl: 0   docusate sodium  (COLACE) 100 MG capsule, Take 1 capsule (100 mg total) by mouth 2 (two) times daily., Disp: 30 capsule, Rfl: 0   fluticasone  (FLONASE ) 50 MCG/ACT nasal spray, Place 1-2 sprays into both nostrils daily as needed for allergies or rhinitis., Disp: 16 g, Rfl: 2   Melatonin 2.5 MG CHEW, Chew 5 mg by mouth at bedtime as needed (sleep.)., Disp: , Rfl:    Multiple Vitamin (MULITIVITAMIN WITH MINERALS) TABS, Take 1 tablet by mouth daily. Alive Women's Multivitamin 50+, Disp: , Rfl:    Naphazoline-Pheniramine (  OPCON-A) 0.027-0.315 % SOLN, Place 1-2 drops into both eyes 3 (three) times daily as needed (dry/irritated eyes.)., Disp: , Rfl:    Phenyleph-Doxylamine-DM-APAP (ALKA SELTZER PLUS PO), Take 2 tablets by mouth every 4 (four) hours as needed (cold symptoms.). , Disp: , Rfl:    Senna (SENOKOT LAXATIVE GUMMIES PO), Take 1 tablet by mouth daily as needed (constipation.)., Disp: , Rfl:    vitamin C (ASCORBIC ACID) 500 MG tablet, Take 500 mg by mouth 2 (two) times a week. , Disp: , Rfl:    Vitamin D , Ergocalciferol , (DRISDOL ) 1.25 MG (50000 UNIT) CAPS capsule, Take 1 capsule (50,000 Units total) by mouth every 7 (seven) days for a total of 12 doses., Disp: 8 capsule, Rfl: 0   ibuprofen   (ADVIL ) 800 MG tablet, Take 1 tablet (800 mg total) by mouth every 8 (eight) hours., Disp: 30 tablet, Rfl: 1  Review of Systems:  Negative unless indicated in HPI.   Physical Exam: Vitals:   06/07/24 0900  BP: 134/70  Pulse: 64  SpO2: 98%  Weight: 166 lb 1.6 oz (75.3 kg)  Height: 4' 11 (1.499 m)    Body mass index is 33.55 kg/m.   Physical Exam Vitals reviewed.  Constitutional:      General: She is not in acute distress.    Appearance: Normal appearance. She is not ill-appearing, toxic-appearing or diaphoretic.  HENT:     Head: Normocephalic.     Right Ear: Tympanic membrane, ear canal and external ear normal. There is no impacted cerumen.     Left Ear: Tympanic membrane, ear canal and external ear normal. There is no impacted cerumen.     Nose: Nose normal.     Mouth/Throat:     Mouth: Mucous membranes are moist.     Pharynx: Oropharynx is clear. No oropharyngeal exudate or posterior oropharyngeal erythema.  Eyes:     General: No scleral icterus.       Right eye: No discharge.        Left eye: No discharge.     Conjunctiva/sclera: Conjunctivae normal.     Pupils: Pupils are equal, round, and reactive to light.  Neck:     Vascular: No carotid bruit.  Cardiovascular:     Rate and Rhythm: Normal rate and regular rhythm.     Pulses: Normal pulses.     Heart sounds: Normal heart sounds.  Pulmonary:     Effort: Pulmonary effort is normal. No respiratory distress.     Breath sounds: Normal breath sounds.  Abdominal:     General: Abdomen is flat. Bowel sounds are normal.     Palpations: Abdomen is soft.  Musculoskeletal:        General: Normal range of motion.     Cervical back: Normal range of motion.  Skin:    General: Skin is warm and dry.  Neurological:     General: No focal deficit present.     Mental Status: She is alert and oriented to person, place, and time. Mental status is at baseline.  Psychiatric:        Mood and Affect: Mood normal.         Behavior: Behavior normal.        Thought Content: Thought content normal.        Judgment: Judgment normal.     Flowsheet Row Office Visit from 06/07/2024 in [facility]  PHQ-9 Total Score 0     Impression and Plan:  Encounter for preventive  health examination  Need for immunization against influenza -     Flu vaccine trivalent PF, 6mos and older(Flulaval,Afluria,Fluarix,Fluzone)  Rheumatoid arthritis involving multiple sites, unspecified whether rheumatoid factor present (HCC) -     Ibuprofen ; Take 1 tablet (800 mg total) by mouth every 8 (eight) hours.  Dispense: 30 tablet; Refill: 1 -     CBC with Differential/Platelet; Future -     Comprehensive metabolic panel with GFR; Future -     TSH; Future -     Vitamin B12; Future  Need for vaccination against Streptococcus pneumoniae -     Pneumococcal conjugate vaccine 20-valent  Vitamin D  deficiency -     VITAMIN D  25 Hydroxy (Vit-D Deficiency, Fractures); Future  Mixed hyperlipidemia -     Lipid panel; Future  IGT (impaired glucose tolerance) -     Hemoglobin A1c; Future  Screening for colon cancer -     Ambulatory referral to Gastroenterology  Skin cancer screening -     Ambulatory referral to Dermatology  Obesity (BMI 30.0-34.9) -     Amb Ref to Medical Weight Management   -Recommend routine eye and dental care. -Healthy lifestyle discussed in detail. -Labs to be updated today. -Prostate cancer screening: N/A Health Maintenance  Topic Date Due   Hepatitis B Vaccine (1 of 3 - 19+ 3-dose series) Never done   Colon Cancer Screening  09/14/2023   COVID-19 Vaccine (3 - 2025-26 season) 06/25/2024*   Breast Cancer Screening  01/08/2026   DTaP/Tdap/Td vaccine (2 - Td or Tdap) 08/24/2027   Pap with HPV screening  01/04/2028   Pneumococcal Vaccine for age over 17  Completed   Flu Shot  Completed   Hepatitis C Screening  Completed   HIV Screening  Completed   Zoster (Shingles)  Vaccine  Completed   HPV Vaccine  Aged Out   Meningitis B Vaccine  Aged Out  *Topic was postponed. The date shown is not the original due date.    - Flu and PCV 20 in office today.  Will obtain COVID at pharmacy. - Placed referral to GI for screening colonoscopy and for dermatology for routine screening. -Discussed healthy lifestyle, including increased physical activity and better food choices to promote weight loss.  Referral placed for healthy weight and wellness.      Tully Theophilus Andrews, MD [facility]

## 2024-06-08 ENCOUNTER — Other Ambulatory Visit: Payer: Self-pay | Admitting: Internal Medicine

## 2024-06-08 ENCOUNTER — Other Ambulatory Visit (HOSPITAL_COMMUNITY): Payer: Self-pay

## 2024-06-08 DIAGNOSIS — J309 Allergic rhinitis, unspecified: Secondary | ICD-10-CM

## 2024-06-11 ENCOUNTER — Ambulatory Visit: Attending: Obstetrics and Gynecology | Admitting: Physical Therapy

## 2024-06-11 ENCOUNTER — Other Ambulatory Visit (HOSPITAL_COMMUNITY): Payer: Self-pay

## 2024-06-11 ENCOUNTER — Encounter: Payer: Self-pay | Admitting: Physical Therapy

## 2024-06-11 ENCOUNTER — Ambulatory Visit: Payer: Self-pay | Admitting: Internal Medicine

## 2024-06-11 DIAGNOSIS — R278 Other lack of coordination: Secondary | ICD-10-CM | POA: Insufficient documentation

## 2024-06-11 DIAGNOSIS — M6281 Muscle weakness (generalized): Secondary | ICD-10-CM | POA: Diagnosis not present

## 2024-06-11 MED ORDER — FLUTICASONE PROPIONATE 50 MCG/ACT NA SUSP
1.0000 | Freq: Every day | NASAL | 2 refills | Status: AC | PRN
  Filled 2024-06-11: qty 16, 30d supply, fill #0

## 2024-06-11 NOTE — Telephone Encounter (Signed)
 Patient is aware.

## 2024-06-11 NOTE — Therapy (Signed)
 OUTPATIENT PHYSICAL THERAPY FEMALE PELVIC TREATMENT   Patient Name: Danielle Pace MRN: [id number] DOB:11-06-1965, 58 y.o., female Today's Date: 06/11/2024  END OF SESSION:  PT End of Session - 06/11/24 0806     Visit Number 2    Date for Recertification  11/05/24    Authorization Type [facility] [employer]  No Auth Required    PT Start Time 0805    PT Stop Time 0845    PT Time Calculation (min) 40 min    Activity Tolerance Patient tolerated treatment well    Behavior During Therapy WFL for tasks assessed/performed          Past Medical History:  Diagnosis Date   Arthritis    Complication of anesthesia    GERD (gastroesophageal reflux disease)    Irritable bowel syndrome    occationally   Meniscus tear    right   PONV (postoperative nausea and vomiting)    RA (rheumatoid arthritis) (HCC)    Past Surgical History:  Procedure Laterality Date   FOOT SURGERY     JOINT REPLACEMENT     OVARY SURGERY     REDUCTION MAMMAPLASTY  2017   TOTAL KNEE ARTHROPLASTY Right 02/15/2020   Procedure: TOTAL KNEE ARTHROPLASTY;  Surgeon: Yvone Rush, MD;  Location: [facility];  Service: Orthopedics;  Laterality: Right;   Patient Active Problem List   Diagnosis Date Noted   IGT (impaired glucose tolerance) 01/28/2021   Primary osteoarthritis of right knee 02/15/2020   RA (rheumatoid arthritis) (HCC)    Rhinitis, chronic 02/20/2019   Sore throat 02/20/2019   Deviated septum 10/19/2018   Epistaxis, recurrent 10/19/2018   Breast hypertrophy in female 11/03/2015   Vitamin D  deficiency 06/27/2014   Hyperlipidemia 06/27/2014   Dyspepsia and other specified disorders of function of stomach 08/08/2013   Special screening for malignant neoplasms, colon 08/08/2013    PCP: Theophilus Andrews, Tully GRADE, MD  REFERRING PROVIDER: Latisha Medford, MD REFERRING DIAG: N39.3 (ICD-10-CM) - Stress incontinence (female) (female)  THERAPY DIAG:  Other lack of coordination  Muscle weakness  (generalized)  Rationale for Evaluation and Treatment: Rehabilitation  ONSET DATE: 3 years ago- 2022  SUBJECTIVE:                                                                                                                                                                                           SUBJECTIVE STATEMENT: Patient reports that she tried the exercises, she does not want internal.  Patient reports that she got a bill and won't be able to afford it.  Reports that she tried the exercises some   eval 15 mins late for  eval Patient reports that she leaks with sneezing, coughing, laughing, got worse in the last 3 years  Fluid intake: water with lemon, iced coffee, sparkling water  FUNCTIONAL LIMITATIONS: none  PERTINENT HISTORY:  Medications for current condition: no Surgeries: R knee replacement , left knee meniscus tear Other: none Sexual abuse: No  DIAGNOSTIC FINDINGS:  Post-void residual: Voiding Cystourethrogram (VCUG):  Ultrasound: PAIN:  Are you having pain? No NPRS scale: 0/10   PRECAUTIONS: None  RED FLAGS: None   WEIGHT BEARING RESTRICTIONS: No  FALLS:  Has patient fallen in last 6 months? Yes. 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 [employer]  ACTIVITY LEVEL : sits at work, walks with dogs, has a treadmill  PLOF: Independent  PATIENT GOALS: to do treadmill   BOWEL MOVEMENT: no issues  Fiber supplement/laxative Yes stool softener at times  URINATION: Pain with urination: No Fully empty bladder: Yes:                                   Post-void dribble: No Stream: Strong Urgency: sometimes with a cold Frequency:during the night few times                                                         Nocturia: Yes:     Leakage: Coughing, Sneezing, and Laughing Pads/briefs: Yes: 1  INTERCOURSE: not active   PREGNANCY: no kids   PROLAPSE: None   OBJECTIVE:  Note: Objective measures were completed at  Evaluation unless otherwise noted.  DIAGNOSTIC FINDINGS:  none  PATIENT SURVEYS:    PFIQ-7: 0  COGNITION: Overall cognitive status: Within functional limits for tasks assessed     SENSATION: Light touch: Appears intact  LUMBAR SPECIAL TESTS:  Single leg stance test: Positive for bilateral lower extremity weakness, more on left Bilateral hamstring tightness   FUNCTIONAL TESTS:              Sit-up test: 1/4 Squat: difficult Bed mobility: within functional limitations   GAIT: Assistive device utilized: None  POSTURE: rounded shoulders, forward head, and increased thoracic kyphosis   LUMBARAROM/PROM:  A/PROM A/PROM  Eval (% available)  Flexion 75  Extension 75  Right lateral flexion 75  Left lateral flexion 75  Right rotation 75  Left rotation 75   (Blank rows = not tested)  LOWER EXTREMITY ROM:  Passive ROM Right eval Left eval  Hip flexion    Hip extension    Hip abduction    Hip adduction    Hip internal rotation    Hip external rotation    Knee flexion    Knee extension    Ankle dorsiflexion    Ankle plantarflexion    Ankle inversion    Ankle eversion     (Blank rows = not tested)  LOWER EXTREMITY MMT:  MMT Right eval Left eval  Hip flexion 4-/5 4/5  Hip extension    Hip abduction    Hip adduction    Hip internal rotation    Hip external rotation    Knee flexion    Knee extension 4/5 4/5  Ankle dorsiflexion    Ankle plantarflexion    Ankle inversion    Ankle eversion     (  Blank rows = not tested) PALPATION:  General: bilateral upper trap trigger points and tightness  Pelvic Alignment: even  Abdominal: gripping and bracing with upper chest breathing strategies  Diastasis: No but difficult to assess due to adipose tissue Distortion: No  Breathing:  gripping and bracing with upper chest breathing strategies Scar tissue: No                External Perineal Exam: to be assessed ( patient late to eval)                              Internal Pelvic Floor: to be assessed ( patient late to eval)  Patient confirms identification and approves PT to assess internal pelvic floor and treatment Yes  PELVIC MMT:   MMT eval  Vaginal   Internal Anal Sphincter   External Anal Sphincter   Puborectalis   Diastasis Recti   (Blank rows = not tested)        TONE: To be assessed   PROLAPSE: To be assessed   TODAY'S TREATMENT:                                                                                                                              DATE: 06/11/24 Bridging with transverse abdominis breath 20 reps Supine ball press with transverse abdominis breath 20 reps Hip adduction with ball with transverse abdominis breath 20 reps Seated hip adduction with ball 20 reps with transverse abdominis breath    VC's not to hold breath TC's for lateral rib expansion      05/08/2024  EVAL  Examination completed, findings reviewed, pt educated on POC, HEP, and female pelvic floor anatomy, reasoning with pelvic floor assessment internally with pt consent. Pt motivated to participate in PT and agreeable to attempt recommendations.    Neuro reed- ball press with transverse abdominis breath 5 reps Cough with pelvic floor contraction 3 reps Diaphragmatic breathing reed  PATIENT EDUCATION:  Education details: Pt was educated on relevant anatomy, exam findings, home exercise program, plan of care, expectations of PT   Person educated: Patient Education method: Explanation, Demonstration, Tactile cues, Verbal cues, and Handouts Education comprehension: verbalized understanding and needs further education  HOME EXERCISE PROGRAM: Access Code: [email or web address] URL: [email or web address] Date: 05/08/2024 Prepared by: Cori Timea Breed  Exercises - Abdominal Press into [place]  - 1 x daily - 7 x weekly - 2 sets - 10 reps - Seated Cough with Pelvic Floor Contraction and Hand to Mouth  - 1 x daily - 7 x weekly - 2 sets - 10  reps - Supine Bridge with Pelvic Floor Contraction  - 1 x daily - 7 x weekly - 2 sets - 10 reps  ASSESSMENT:  CLINICAL IMPRESSION: Patient was seen today for treatment of SUI. Patient with breath holding tendencies. She did not want internal today due to being post 12 hour shift. Explained core coordination  in great detail. Patient may not be able to afford appts due to financial reasons. Patient did fairly well with exercises, manual therapy and education today. We discussed progress, HEP and recommended consistency with HEP and to ask about vaginal estrogen. Treatment session focused on exercises to improve core coordination. Patient had some  difficulty with lateral rib breathing. Patient is progressing well towards goals and will benefit from continued PT to address deficits, reduce SUI and improve quality of life.       eval Patient is a 58 y.o. F who was seen today for physical therapy evaluation and treatment for stress urinary incontinence. She reported that she has never had children and is not sexually active. She has had leaking with sneezing and coughing that worsened in the last 3 years with bad colds. Patient was late for her initial evaluation and internal pelvic floor exam is postponed to next visit. She does present with right hip weakness, has a history of several knee surgeries and a foot as well. Upper chest breathing strategies with decreased use of respiratory diaphragm, no prolapse symptoms, reported constipation at times. She was educated on HEP focusing on coordination of core and pelvic floor with cough and hip strengthening. Patient is sedentary, her goal is to walk more on her treadmill. She will benefit from PT to address deficits and improve core strength.   OBJECTIVE IMPAIRMENTS: Abnormal gait, decreased coordination, difficulty walking, decreased ROM, decreased strength, and obesity.   ACTIVITY LIMITATIONS: continence and toileting  PARTICIPATION LIMITATIONS:  community activity  PERSONAL FACTORS: Age and Fitness are also affecting patient's functional outcome.   REHAB POTENTIAL: Excellent  CLINICAL DECISION MAKING: Stable/uncomplicated  EVALUATION COMPLEXITY: Low   GOALS: Goals reviewed with patient? Yes  SHORT TERM GOALS: Target date: 06/05/2024    Pt will be independent with HEP.   Baseline: Goal status: INITIAL  2.  Patient will be educated on bladder irritants Baseline:  Goal status: INITIAL  3.  Patient will complete a bladder diary Baseline:  Goal status: INITIAL  4.  Patient will demonstrate improved diaphragmatic breath and abdominal muscle activation without bracing to have improved core coordination Baseline:  Goal status: INITIAL   LONG TERM GOALS: Target date: 11/05/2024  Pt will soak 0 pads/ day in order to run errands and not have to interrupt daily tasks.  Baseline:  Goal status: INITIAL  2. Pt will be independent with advanced HEP.   Baseline:  Goal status: INITIAL  3.  Patient will demonstrate 5/5 bilateral hip and knee strength Baseline:  Goal status: INITIAL  4.  Pt will be able to functional actions such as coughing, sneezing and laughing without leakage  Baseline:  Goal status: INITIAL    PLAN:  PT FREQUENCY: 1x/week  PT DURATION: 6 months  PLANNED INTERVENTIONS: 97110-Therapeutic exercises, 97530- Therapeutic activity, 97112- Neuromuscular re-education, 97535- Self Care, [id number]- Manual therapy, [phone]- Electrical stimulation (manual), Patient/Family education, Joint mobilization, Joint manipulation, Spinal manipulation, Spinal mobilization, Scar mobilization, Cryotherapy, Moist heat, and Biofeedback  PLAN FOR NEXT SESSION: continue core strengthening, hip strengthening, focus on breathing, reduce breath holding, internal pelvic floor assessment, ask about estrogen   Estrellita Lasky, PT 06/11/2024, 8:07 AM

## 2024-06-13 ENCOUNTER — Other Ambulatory Visit (HOSPITAL_COMMUNITY): Payer: Self-pay

## 2024-06-17 ENCOUNTER — Other Ambulatory Visit: Payer: Self-pay | Admitting: Medical Genetics

## 2024-07-31 ENCOUNTER — Other Ambulatory Visit (HOSPITAL_COMMUNITY): Payer: Self-pay

## 2024-07-31 ENCOUNTER — Other Ambulatory Visit: Payer: Self-pay | Admitting: Internal Medicine

## 2024-07-31 ENCOUNTER — Other Ambulatory Visit: Payer: Self-pay

## 2024-07-31 DIAGNOSIS — E782 Mixed hyperlipidemia: Secondary | ICD-10-CM

## 2024-07-31 MED ORDER — ATORVASTATIN CALCIUM 20 MG PO TABS
20.0000 mg | ORAL_TABLET | Freq: Every day | ORAL | 1 refills | Status: AC
  Filled 2024-07-31: qty 90, 90d supply, fill #0

## 2024-08-01 ENCOUNTER — Other Ambulatory Visit (HOSPITAL_COMMUNITY)

## 2024-08-24 ENCOUNTER — Other Ambulatory Visit: Payer: Self-pay | Admitting: Medical Genetics

## 2024-08-24 DIAGNOSIS — Z006 Encounter for examination for normal comparison and control in clinical research program: Secondary | ICD-10-CM

## 2024-10-01 ENCOUNTER — Other Ambulatory Visit (HOSPITAL_COMMUNITY)

## 2025-06-11 ENCOUNTER — Encounter: Admitting: Internal Medicine
